# Patient Record
Sex: Female | Born: 1955
Health system: Southern US, Community
[De-identification: ages and names within clinical notes are randomized; demographics above are authoritative.]

## PROBLEM LIST (undated history)

## (undated) DIAGNOSIS — G43909 Migraine, unspecified, not intractable, without status migrainosus: Secondary | ICD-10-CM

## (undated) DIAGNOSIS — E785 Hyperlipidemia, unspecified: Secondary | ICD-10-CM

## (undated) DIAGNOSIS — I10 Essential (primary) hypertension: Secondary | ICD-10-CM

## (undated) HISTORY — DX: Essential (primary) hypertension: I10

## (undated) HISTORY — DX: Hyperlipidemia, unspecified: E78.5

---

## 2000-02-18 ENCOUNTER — Other Ambulatory Visit: Admission: RE | Admit: 2000-02-18 | Discharge: 2000-02-18 | Payer: Self-pay | Admitting: Family Medicine

## 2000-03-16 ENCOUNTER — Encounter: Admission: RE | Admit: 2000-03-16 | Discharge: 2000-03-16 | Payer: Self-pay | Admitting: Family Medicine

## 2000-03-16 ENCOUNTER — Encounter: Payer: Self-pay | Admitting: Family Medicine

## 2001-03-10 ENCOUNTER — Other Ambulatory Visit: Admission: RE | Admit: 2001-03-10 | Discharge: 2001-03-10 | Payer: Self-pay | Admitting: Family Medicine

## 2001-03-18 ENCOUNTER — Encounter: Admission: RE | Admit: 2001-03-18 | Discharge: 2001-03-18 | Payer: Self-pay | Admitting: Family Medicine

## 2001-03-18 ENCOUNTER — Encounter: Payer: Self-pay | Admitting: Family Medicine

## 2002-03-15 ENCOUNTER — Other Ambulatory Visit: Admission: RE | Admit: 2002-03-15 | Discharge: 2002-03-15 | Payer: Self-pay | Admitting: Family Medicine

## 2002-03-24 ENCOUNTER — Encounter: Payer: Self-pay | Admitting: Family Medicine

## 2002-03-24 ENCOUNTER — Encounter: Admission: RE | Admit: 2002-03-24 | Discharge: 2002-03-24 | Payer: Self-pay | Admitting: Family Medicine

## 2002-11-17 ENCOUNTER — Emergency Department (HOSPITAL_COMMUNITY): Admission: EM | Admit: 2002-11-17 | Discharge: 2002-11-18 | Payer: Self-pay | Admitting: Emergency Medicine

## 2002-11-18 ENCOUNTER — Encounter: Payer: Self-pay | Admitting: Emergency Medicine

## 2003-02-16 ENCOUNTER — Other Ambulatory Visit: Admission: RE | Admit: 2003-02-16 | Discharge: 2003-02-16 | Payer: Self-pay | Admitting: Family Medicine

## 2003-04-13 ENCOUNTER — Encounter: Admission: RE | Admit: 2003-04-13 | Discharge: 2003-04-13 | Payer: Self-pay | Admitting: Family Medicine

## 2003-04-13 ENCOUNTER — Encounter: Payer: Self-pay | Admitting: Family Medicine

## 2004-03-06 ENCOUNTER — Other Ambulatory Visit: Admission: RE | Admit: 2004-03-06 | Discharge: 2004-03-06 | Payer: Self-pay | Admitting: Family Medicine

## 2004-04-18 ENCOUNTER — Encounter: Admission: RE | Admit: 2004-04-18 | Discharge: 2004-04-18 | Payer: Self-pay | Admitting: Family Medicine

## 2005-03-20 ENCOUNTER — Ambulatory Visit: Payer: Self-pay | Admitting: Family Medicine

## 2005-03-21 ENCOUNTER — Ambulatory Visit: Payer: Self-pay | Admitting: Family Medicine

## 2005-03-21 ENCOUNTER — Other Ambulatory Visit: Admission: RE | Admit: 2005-03-21 | Discharge: 2005-03-21 | Payer: Self-pay | Admitting: Family Medicine

## 2005-03-21 LAB — CONVERTED CEMR LAB: Pap Smear: NORMAL

## 2005-03-27 ENCOUNTER — Ambulatory Visit: Payer: Self-pay | Admitting: Family Medicine

## 2005-04-21 ENCOUNTER — Encounter: Admission: RE | Admit: 2005-04-21 | Discharge: 2005-04-21 | Payer: Self-pay | Admitting: Family Medicine

## 2005-07-09 ENCOUNTER — Ambulatory Visit: Payer: Self-pay | Admitting: Family Medicine

## 2005-10-13 ENCOUNTER — Ambulatory Visit: Payer: Self-pay | Admitting: Family Medicine

## 2005-10-31 ENCOUNTER — Ambulatory Visit: Payer: Self-pay | Admitting: Family Medicine

## 2005-11-17 ENCOUNTER — Ambulatory Visit: Payer: Self-pay | Admitting: Family Medicine

## 2006-01-06 ENCOUNTER — Ambulatory Visit: Payer: Self-pay | Admitting: Family Medicine

## 2006-01-14 ENCOUNTER — Ambulatory Visit: Payer: Self-pay | Admitting: Family Medicine

## 2006-04-07 ENCOUNTER — Ambulatory Visit: Payer: Self-pay | Admitting: Family Medicine

## 2006-04-10 ENCOUNTER — Other Ambulatory Visit: Admission: RE | Admit: 2006-04-10 | Discharge: 2006-04-10 | Payer: Self-pay | Admitting: Family Medicine

## 2006-04-10 ENCOUNTER — Ambulatory Visit: Payer: Self-pay | Admitting: Family Medicine

## 2006-04-10 ENCOUNTER — Encounter: Payer: Self-pay | Admitting: Family Medicine

## 2006-04-22 ENCOUNTER — Encounter: Admission: RE | Admit: 2006-04-22 | Discharge: 2006-04-22 | Payer: Self-pay | Admitting: Family Medicine

## 2006-07-20 ENCOUNTER — Ambulatory Visit: Payer: Self-pay | Admitting: Family Medicine

## 2007-04-07 ENCOUNTER — Encounter: Payer: Self-pay | Admitting: Family Medicine

## 2007-04-07 DIAGNOSIS — I10 Essential (primary) hypertension: Secondary | ICD-10-CM | POA: Insufficient documentation

## 2007-04-07 DIAGNOSIS — G43009 Migraine without aura, not intractable, without status migrainosus: Secondary | ICD-10-CM | POA: Insufficient documentation

## 2007-04-13 ENCOUNTER — Ambulatory Visit: Payer: Self-pay | Admitting: Family Medicine

## 2007-04-13 DIAGNOSIS — E7800 Pure hypercholesterolemia, unspecified: Secondary | ICD-10-CM | POA: Insufficient documentation

## 2007-04-13 LAB — CONVERTED CEMR LAB
AST: 17 units/L (ref 0–37)
Alkaline Phosphatase: 50 units/L (ref 39–117)
BUN: 11 mg/dL (ref 6–23)
GFR calc Af Amer: 86 mL/min
GFR calc non Af Amer: 71 mL/min
Glucose, Bld: 97 mg/dL (ref 70–99)
HDL: 43.4 mg/dL (ref >39.0)
Potassium: 3.6 meq/L (ref 3.5–5.1)
TSH: 3.62 microintl units/mL (ref 0.35–5.50)
VLDL: 16 mg/dL (ref 0–40)

## 2007-04-15 ENCOUNTER — Other Ambulatory Visit: Admission: RE | Admit: 2007-04-15 | Discharge: 2007-04-15 | Payer: Self-pay | Admitting: Family Medicine

## 2007-04-15 ENCOUNTER — Encounter: Payer: Self-pay | Admitting: Family Medicine

## 2007-04-15 ENCOUNTER — Ambulatory Visit: Payer: Self-pay | Admitting: Family Medicine

## 2007-04-20 ENCOUNTER — Encounter: Payer: Self-pay | Admitting: Family Medicine

## 2007-04-27 ENCOUNTER — Encounter: Admission: RE | Admit: 2007-04-27 | Discharge: 2007-04-27 | Payer: Self-pay | Admitting: Family Medicine

## 2007-04-28 ENCOUNTER — Encounter: Payer: Self-pay | Admitting: Family Medicine

## 2007-10-20 ENCOUNTER — Ambulatory Visit: Payer: Self-pay | Admitting: Family Medicine

## 2008-04-17 ENCOUNTER — Ambulatory Visit: Payer: Self-pay | Admitting: Family Medicine

## 2008-04-17 LAB — CONVERTED CEMR LAB
ALT: 18 units/L (ref 0–35)
AST: 20 units/L (ref 0–37)
Bilirubin, Direct: 0.1 mg/dL (ref 0.0–0.3)
CO2: 28 meq/L (ref 19–32)
Calcium: 9.3 mg/dL (ref 8.4–10.5)
Cholesterol: 178 mg/dL (ref 0–200)
Creatinine, Ser: 1 mg/dL (ref 0.4–1.2)
Eosinophils Relative: 0.9 % (ref 0.0–5.0)
GFR calc Af Amer: 75 mL/min
GFR calc non Af Amer: 62 mL/min
Glucose, Bld: 83 mg/dL (ref 70–99)
HDL: 50.3 mg/dL (ref >39.0)
Hemoglobin: 13.3 g/dL (ref 12.0–15.0)
MCHC: 32.5 g/dL (ref 30.0–36.0)
MCV: 81.2 fL (ref 78.0–100.0)
Monocytes Absolute: 0.3 10*3/uL (ref 0.1–1.0)
Neutrophils Relative %: 72.4 % (ref 43.0–77.0)
Platelets: 303 10*3/uL (ref 150–400)
RDW: 12.7 % (ref 11.5–14.6)
TSH: 2.45 microintl units/mL (ref 0.35–5.50)
Total Bilirubin: 0.8 mg/dL (ref 0.3–1.2)
Total Protein: 7.2 g/dL (ref 6.0–8.3)
Triglycerides: 106 mg/dL (ref 0–149)

## 2008-04-20 ENCOUNTER — Encounter: Payer: Self-pay | Admitting: Family Medicine

## 2008-04-20 ENCOUNTER — Ambulatory Visit: Payer: Self-pay | Admitting: Family Medicine

## 2008-04-20 ENCOUNTER — Other Ambulatory Visit: Admission: RE | Admit: 2008-04-20 | Discharge: 2008-04-20 | Payer: Self-pay | Admitting: Family Medicine

## 2008-04-27 ENCOUNTER — Encounter: Admission: RE | Admit: 2008-04-27 | Discharge: 2008-04-27 | Payer: Self-pay | Admitting: Family Medicine

## 2008-05-01 ENCOUNTER — Encounter (INDEPENDENT_AMBULATORY_CARE_PROVIDER_SITE_OTHER): Payer: Self-pay | Admitting: *Deleted

## 2008-10-16 ENCOUNTER — Ambulatory Visit: Payer: Self-pay | Admitting: Family Medicine

## 2008-12-04 ENCOUNTER — Telehealth: Payer: Self-pay | Admitting: Family Medicine

## 2009-04-13 ENCOUNTER — Ambulatory Visit: Payer: Self-pay | Admitting: Family Medicine

## 2009-04-13 LAB — CONVERTED CEMR LAB
ALT: 21 units/L (ref 0–35)
Albumin: 3.7 g/dL (ref 3.5–5.2)
Alkaline Phosphatase: 62 units/L (ref 39–117)
Basophils Absolute: 0 10*3/uL (ref 0.0–0.1)
Basophils Relative: 0.2 % (ref 0.0–3.0)
Bilirubin, Direct: 0.1 mg/dL (ref 0.0–0.3)
Eosinophils Absolute: 0.1 10*3/uL (ref 0.0–0.7)
Glucose, Bld: 96 mg/dL (ref 70–99)
HDL: 49.8 mg/dL (ref >39.00)
Hemoglobin: 12.6 g/dL (ref 12.0–15.0)
LDL Cholesterol: 104 mg/dL — ABNORMAL HIGH (ref 0–99)
Lymphocytes Relative: 22.1 % (ref 12.0–46.0)
Lymphs Abs: 1.7 10*3/uL (ref 0.7–4.0)
MCHC: 33.5 g/dL (ref 30.0–36.0)
Monocytes Absolute: 0.5 10*3/uL (ref 0.1–1.0)
Platelets: 281 10*3/uL (ref 150.0–400.0)
RDW: 12.9 % (ref 11.5–14.6)
Sodium: 141 meq/L (ref 135–145)
TSH: 2.12 microintl units/mL (ref 0.35–5.50)
Total Bilirubin: 0.8 mg/dL (ref 0.3–1.2)
Total CHOL/HDL Ratio: 3
Total Protein: 7 g/dL (ref 6.0–8.3)
WBC: 7.7 10*3/uL (ref 4.5–10.5)

## 2009-04-18 ENCOUNTER — Other Ambulatory Visit: Admission: RE | Admit: 2009-04-18 | Discharge: 2009-04-18 | Payer: Self-pay | Admitting: Family Medicine

## 2009-04-18 ENCOUNTER — Ambulatory Visit: Payer: Self-pay | Admitting: Family Medicine

## 2009-04-18 ENCOUNTER — Encounter: Payer: Self-pay | Admitting: Family Medicine

## 2009-04-18 DIAGNOSIS — R011 Cardiac murmur, unspecified: Secondary | ICD-10-CM | POA: Insufficient documentation

## 2009-04-18 LAB — CONVERTED CEMR LAB: Pap Smear: NORMAL

## 2009-04-19 ENCOUNTER — Encounter (INDEPENDENT_AMBULATORY_CARE_PROVIDER_SITE_OTHER): Payer: Self-pay | Admitting: *Deleted

## 2009-04-26 ENCOUNTER — Ambulatory Visit: Payer: Self-pay

## 2009-04-26 ENCOUNTER — Ambulatory Visit: Payer: Self-pay | Admitting: Family Medicine

## 2009-04-26 ENCOUNTER — Encounter: Payer: Self-pay | Admitting: Family Medicine

## 2009-04-26 ENCOUNTER — Encounter (INDEPENDENT_AMBULATORY_CARE_PROVIDER_SITE_OTHER): Payer: Self-pay | Admitting: *Deleted

## 2009-04-26 LAB — CONVERTED CEMR LAB: OCCULT 3: NEGATIVE

## 2009-04-26 LAB — FECAL OCCULT BLOOD, GUAIAC: Fecal Occult Blood: NEGATIVE

## 2009-05-01 ENCOUNTER — Encounter: Admission: RE | Admit: 2009-05-01 | Discharge: 2009-05-01 | Payer: Self-pay | Admitting: Family Medicine

## 2009-05-03 ENCOUNTER — Encounter: Admission: RE | Admit: 2009-05-03 | Discharge: 2009-05-03 | Payer: Self-pay | Admitting: Family Medicine

## 2009-11-05 ENCOUNTER — Encounter: Admission: RE | Admit: 2009-11-05 | Discharge: 2009-11-05 | Payer: Self-pay | Admitting: Family Medicine

## 2009-11-05 DIAGNOSIS — R928 Other abnormal and inconclusive findings on diagnostic imaging of breast: Secondary | ICD-10-CM | POA: Insufficient documentation

## 2010-01-17 ENCOUNTER — Telehealth: Payer: Self-pay | Admitting: Family Medicine

## 2010-01-24 LAB — HM PAP SMEAR

## 2010-04-23 ENCOUNTER — Other Ambulatory Visit: Admission: RE | Admit: 2010-04-23 | Discharge: 2010-04-23 | Payer: Self-pay | Admitting: Family Medicine

## 2010-04-23 ENCOUNTER — Ambulatory Visit: Payer: Self-pay | Admitting: Family Medicine

## 2010-04-23 LAB — CONVERTED CEMR LAB
Cholesterol, target level: 200 mg/dL
LDL Goal: 160 mg/dL

## 2010-04-24 ENCOUNTER — Ambulatory Visit: Payer: Self-pay | Admitting: Family Medicine

## 2010-04-24 DIAGNOSIS — R5381 Other malaise: Secondary | ICD-10-CM | POA: Insufficient documentation

## 2010-04-24 DIAGNOSIS — R5383 Other fatigue: Secondary | ICD-10-CM

## 2010-04-26 LAB — CONVERTED CEMR LAB
ALT: 23 units/L (ref 0–35)
AST: 23 units/L (ref 0–37)
Albumin: 3.7 g/dL (ref 3.5–5.2)
Alkaline Phosphatase: 61 units/L (ref 39–117)
BUN: 13 mg/dL (ref 6–23)
Basophils Absolute: 0.1 10*3/uL (ref 0.0–0.1)
CO2: 27 meq/L (ref 19–32)
Eosinophils Absolute: 0.1 10*3/uL (ref 0.0–0.7)
Eosinophils Relative: 1.1 % (ref 0.0–5.0)
GFR calc non Af Amer: 88.85 mL/min (ref >60)
Glucose, Bld: 87 mg/dL (ref 70–99)
HDL: 62.9 mg/dL (ref >39.00)
Hemoglobin: 12.2 g/dL (ref 12.0–15.0)
Lymphs Abs: 2.3 10*3/uL (ref 0.7–4.0)
Monocytes Absolute: 0.5 10*3/uL (ref 0.1–1.0)
Monocytes Relative: 5.9 % (ref 3.0–12.0)
Neutrophils Relative %: 65.2 % (ref 43.0–77.0)
Platelets: 286 10*3/uL (ref 150.0–400.0)
Potassium: 3.9 meq/L (ref 3.5–5.1)
RBC: 4.46 M/uL (ref 3.87–5.11)
Sodium: 142 meq/L (ref 135–145)
Total CHOL/HDL Ratio: 3
WBC: 8.6 10*3/uL (ref 4.5–10.5)

## 2010-05-02 LAB — CONVERTED CEMR LAB: Pap Smear: NEGATIVE

## 2010-05-03 ENCOUNTER — Encounter (INDEPENDENT_AMBULATORY_CARE_PROVIDER_SITE_OTHER): Payer: Self-pay | Admitting: *Deleted

## 2010-05-10 ENCOUNTER — Encounter: Admission: RE | Admit: 2010-05-10 | Discharge: 2010-05-10 | Payer: Self-pay | Admitting: Family Medicine

## 2010-05-17 ENCOUNTER — Encounter (INDEPENDENT_AMBULATORY_CARE_PROVIDER_SITE_OTHER): Payer: Self-pay

## 2010-05-21 ENCOUNTER — Ambulatory Visit: Payer: Self-pay | Admitting: Gastroenterology

## 2010-09-13 ENCOUNTER — Ambulatory Visit: Payer: Self-pay | Admitting: Internal Medicine

## 2010-09-13 DIAGNOSIS — J019 Acute sinusitis, unspecified: Secondary | ICD-10-CM | POA: Insufficient documentation

## 2010-11-11 ENCOUNTER — Encounter
Admission: RE | Admit: 2010-11-11 | Discharge: 2010-11-11 | Payer: Self-pay | Source: Home / Self Care | Attending: Family Medicine | Admitting: Family Medicine

## 2010-12-21 ENCOUNTER — Other Ambulatory Visit: Payer: Self-pay | Admitting: Family Medicine

## 2010-12-21 DIAGNOSIS — N63 Unspecified lump in unspecified breast: Secondary | ICD-10-CM

## 2010-12-31 NOTE — Letter (Signed)
Summary: [facility] Instructions  [facility]  [address] [place], [place] [address]   Phone: [phone]  Fax: [phone]       KHALESSI BLOUGH    21-Jun-1956    MRN: [id number]        Procedure Day Dorna Bloom:  Farrell Ours  06/07/10     Arrival Time:  9:30AM     Procedure Time:  10:30AM     Location of Procedure:                    _X _  [facility] (4th Floor)                       PREPARATION FOR COLONOSCOPY WITH MOVIPREP   Starting 5 days prior to your procedure 06/02/10 do not eat nuts, seeds, popcorn, corn, beans, peas,  salads, or any raw vegetables.  Do not take any fiber supplements (e.g. Metamucil, Citrucel, and Benefiber).  THE DAY BEFORE YOUR PROCEDURE         DATE: 06/06/10  DAY: THURSDAY  1.  Drink clear liquids the entire day-NO SOLID FOOD  2.  Do not drink anything colored red or purple.  Avoid juices with pulp.  No orange juice.  3.  Drink at least 64 oz. (8 glasses) of fluid/clear liquids during the day to prevent dehydration and help the prep work efficiently.  CLEAR LIQUIDS INCLUDE: Water Jello Ice Popsicles Tea (sugar ok, no milk/cream) Powdered fruit flavored drinks Coffee (sugar ok, no milk/cream) Gatorade Juice: apple, white grape, white cranberry  Lemonade Clear bullion, consomm, broth Carbonated beverages (any kind) Strained chicken noodle soup Hard Candy                             4.  In the morning, mix first dose of MoviPrep solution:    Empty 1 Pouch A and 1 Pouch B into the disposable container    Add lukewarm drinking water to the top line of the container. Mix to dissolve    Refrigerate (mixed solution should be used within 24 hrs)  5.  Begin drinking the prep at 5:00 p.m. The MoviPrep container is divided by 4 marks.   Every 15 minutes drink the solution down to the next mark (approximately 8 oz) until the full liter is complete.   6.  Follow completed prep with 16 oz of clear liquid of your choice (Nothing red  or purple).  Continue to drink clear liquids until bedtime.  7.  Before going to bed, mix second dose of MoviPrep solution:    Empty 1 Pouch A and 1 Pouch B into the disposable container    Add lukewarm drinking water to the top line of the container. Mix to dissolve    Refrigerate  THE DAY OF YOUR PROCEDURE      DATE: 06/07/10  DAY: FRIDAY  Beginning at 5:30AM (5 hours before procedure):         1. Every 15 minutes, drink the solution down to the next mark (approx 8 oz) until the full liter is complete.  2. Follow completed prep with 16 oz. of clear liquid of your choice.    3. You may drink clear liquids until 8:30AM (2 HOURS BEFORE PROCEDURE).   MEDICATION INSTRUCTIONS  Unless otherwise instructed, you should take regular prescription medications with a small sip of water   as early as possible the morning of  your procedure..  Additional medication instructions: Do not take Diovan am of procedure.         OTHER INSTRUCTIONS  You will need a responsible adult at least 54 years of age to accompany you and drive you home.   This person must remain in the waiting room during your procedure.  Wear loose fitting clothing that is easily removed.  Leave jewelry and other valuables at home.  However, you may wish to bring a book to read or  an iPod/MP3 player to listen to music as you wait for your procedure to start.  Remove all body piercing jewelry and leave at home.  Total time from sign-in until discharge is approximately 2-3 hours.  You should go home directly after your procedure and rest.  You can resume normal activities the  day after your procedure.  The day of your procedure you should not:   Drive   Make legal decisions   Operate machinery   Drink alcohol   Return to work  You will receive specific instructions about eating, activities and medications before you leave.    The above instructions have been reviewed and explained to me by   Ulis Rias RN  May 21, 2010 8:54 AM     I fully understand and can verbalize these instructions _____________________________ Date _________

## 2010-12-31 NOTE — Assessment & Plan Note (Signed)
Summary: CPX AND TRANSFER CARE FROM [facility]   Vital Signs:  Patient profile:   55 year old female Height:      62.25 inches Weight:      159.4 pounds BMI:     29.03 Temp:     97.9 degrees F oral Pulse rate:   68 / minute Pulse rhythm:   regular BP sitting:   120 / 84  (left arm) Cuff size:   regular  Vitals Entered By: Benny Lennert CMA Duncan Dull) (Apr 23, 2010 11:45 AM)  History of Present Illness: Chief complaint cpx and transfer from [facility] The patient is here for annual wellness exam and preventative care.    Migraines well controlled.Marland Kitchenonce per month on topamax 75 mg daily.  Hypertension History:      She denies headache, chest pain, palpitations, dyspnea with exertion, peripheral edema, neurologic problems, syncope, and side effects from treatment.  She notes no problems with any antihypertensive medication side effects.  rarelty checking. Marland Kitchen        Positive major cardiovascular risk factors include hyperlipidemia and hypertension.  Negative major cardiovascular risk factors include female age less than 32 years old and non-tobacco-user status.    Lipid Management History:      Positive NCEP/ATP III risk factors include hypertension.  Negative NCEP/ATP III risk factors include female age less than 48 years old and non-tobacco-user status.      Allergies (verified): No Known Drug Allergies  Past History:  Past medical, surgical, family and social histories (including risk factors) reviewed, and no changes noted (except as noted below).  Past Medical History: Reviewed history from 04/07/2007 and no changes required. Hypertension  Past Surgical History: Reviewed history from 04/26/2009 and no changes required. ECHO Mild MR, EF 55-60% 04/26/09  Family History: Reviewed history from 04/18/2009 and no changes required. Father dec 53 gunshot wound to head Mother  A 72  HTN   Ca Ovaries, recurrence Brother A 53 HTN Brother A 45  Social History: Reviewed history  from 04/07/2007 and no changes required. Occupation: [employer] Married lives w/ husband Never Smoked Alcohol use-no Drug use-no  Review of Systems General:  Complains of fatigue; denies fever. CV:  Denies chest pain or discomfort. Resp:  Denies shortness of breath. GI:  Denies abdominal pain. GU:  Denies dysuria.  Physical Exam  General:  Well-developed,well-nourished,in no acute distress; alert,appropriate and cooperative throughout examination Eyes:  Conjunctiva clear bilaterally.  Ears:  External ear exam shows no significant lesions or deformities.  Otoscopic examination reveals clear canals, tympanic membranes are intact bilaterally without bulging, retraction, inflammation or discharge. Hearing is grossly normal bilaterally. Nose:  External nasal examination shows no deformity or inflammation. Nasal mucosa are pink and moist without lesions or exudates. Mouth:  Oral mucosa and oropharynx without lesions or exudates.  Teeth in good repair. Neck:  No deformities, masses, or tenderness noted. Chest Wall:  No deformities, masses, or tenderness noted. Breasts:  No mass, nodules, thickening, tenderness, bulging, retraction, inflamation, nipple discharge or skin changes noted.   Lungs:  Normal respiratory effort, chest expands symmetrically. Lungs are clear to auscultation, no crackles or wheezes. Heart:  Normal rate and regular rhythm. S1 and S2 normal without gallop, click, rub or other extra sounds. Murmur I-II/VI best at RUSB, no radiation heard. Abdomen:  Bowel sounds positive,abdomen soft and non-tender without masses, organomegaly or hernias noted. Genitalia:  normal introitus, no external lesions, no vaginal discharge, mucosa pink and moist, normal uterus size and position, and no adnexal  masses or tenderness.   no pap performed Pulses:  R and L carotid,radial,femoral,dorsalis pedis and posterior tibial pulses are full and equal bilaterally Extremities:  No clubbing,  cyanosis, edema, or deformity noted with normal full range of motion of all joints.   Skin:  Intact without suspicious lesions or rashes Psych:  Cognition and judgment appear intact. Alert and cooperative with normal attention span and concentration. No apparent delusions, illusions, hallucinations   Impression & Recommendations:  Problem # 1:  HEALTH MAINTENANCE EXAM (ICD-V70.0) The patient's preventative maintenance and recommended screening tests for an annual wellness exam were reviewed in full today. Brought up to date unless services declined.  Counselled on the importance of diet, exercise, and its role in overall health and mortality. The patient's FH and SH was reviewed, including their home life, tobacco status, and drug and alcohol status.     Problem # 2:  HYPERTENSION (ICD-401.9) Well controlled. Continue current medication.  Her updated medication list for this problem includes:    Atenolol 100 Mg Tabs (Atenolol) .Marland Kitchen... 1 tablet daily by mouth    Diovan Hct 160-12.5 Mg Tabs (Valsartan-hydrochlorothiazide) .Marland Kitchen... 1 tablet daily by mouth  BP today: 120/84 Prior BP: 120/70 (04/18/2009)  10 Yr Risk Heart Disease: 8 %  Labs Reviewed: K+: 3.8 (04/13/2009) Creat: : 1.0 (04/13/2009)   Chol: 166 (04/13/2009)   HDL: 49.80 (04/13/2009)   LDL: 104 (04/13/2009)   TG: 59.0 (04/13/2009)  Problem # 3:  FATIGUE (ICD-780.79) eval with labs. Increase exercise and healthy eating habits.   Problem # 4:  MIGRAINE, COMMON (ICD-346.10) Well controlled. Continue current medication. Encouraged exercise, weight loss, healthy eating habits.  The following medications were removed from the medication list:    Mobic 7.5 Mg Tabs (Meloxicam) .Marland Kitchen... 1 daily by mouth as directed    Hydrocodone-acetaminophen 5-500 Mg Tabs (Hydrocodone-acetaminophen) .Marland Kitchen... 1 every4 hours as directed by mouth Her updated medication list for this problem includes:    Atenolol 100 Mg Tabs (Atenolol) .Marland Kitchen... 1 tablet daily  by mouth    Imitrex 100 Mg Tabs (Sumatriptan succinate) .Marland Kitchen... Take one by mouth as directed prn  Complete Medication List: 1)  Atenolol 100 Mg Tabs (Atenolol) .Marland Kitchen.. 1 tablet daily by mouth 2)  Diovan Hct 160-12.5 Mg Tabs (Valsartan-hydrochlorothiazide) .Marland Kitchen.. 1 tablet daily by mouth 3)  Topamax 25 Mg Tabs (Topiramate) .... Take one by mouth daily 4)  Clarinex 5 Mg Tabs (Desloratadine) .... Take one by mouth daily prn 5)  Imitrex 100 Mg Tabs (Sumatriptan succinate) .... Take one by mouth as directed prn 6)  Nortrel 7/7/7 0.5/0.75/1-35 Mg-mcg Tabs (Norethin-eth estrad triphasic) .Marland Kitchen.. 1 daily by mouth as directed  Other Orders: Gastroenterology Referral (GI)  Hypertension Assessment/Plan:      The patient's hypertensive risk group is category B: At least one risk factor (excluding diabetes) with no target organ damage.  Her calculated 10 year risk of coronary heart disease is 8 %.  Today's blood pressure is 120/84.  Her blood pressure goal is < 140/90.  Lipid Assessment/Plan:      Based on NCEP/ATP III, the patient's risk factor category is "0-1 risk factors".  The patient's lipid goals are as follows: Total cholesterol goal is 200; LDL cholesterol goal is 160; HDL cholesterol goal is 40; Triglyceride goal is 150.    Patient Instructions: 1)  Fasting lipids, CMET Dx 272.0, TSH, cbc Dx 780.79 2)  Keep appt for early breast mammogram next month.Return stool cards. 3)  Referral Appointment Information 4)  Day/Date:  5)  Time: 6)  Place/MD: 7)  Address: 8)  Phone/Fax: 9)  Patient given appointment information. Information/Orders faxed/mailed.   Current Allergies (reviewed today): No known allergies  ifob test canceled per doctor. Flex Sig Next Due:  Not Indicated Last PAP:  Normal, Satisfactory (04/18/2009 4:17:19 PM) PAP Next Due:  1 yr Mother with ovarian cancer.

## 2010-12-31 NOTE — Miscellaneous (Signed)
Summary: Lec previsit  Clinical Lists Changes  Medications: Added new medication of MOVIPREP 100 GM  SOLR (PEG-KCL-NACL-NASULF-NA ASC-C) As per prep instructions. - Signed Rx of MOVIPREP 100 GM  SOLR (PEG-KCL-NACL-NASULF-NA ASC-C) As per prep instructions.;  #1 x 0;  Signed;  Entered by: Ulis Rias RN;  Authorized by: Rachael Fee MD;  Method used: [id number] [address], [place], [place], [place]  [address], Ph: [phone], Fax: [phone] Observations: Added new observation of NKA: T (05/21/2010 7:53)    Prescriptions: MOVIPREP 100 GM  SOLR (PEG-KCL-NACL-NASULF-NA ASC-C) As per prep instructions.  #1 x 0   Entered by:   Ulis Rias RN   Authorized by:   Rachael Fee MD   Signed by:   Ulis Rias RN on 05/21/2010   Method used:   [facility] [id number]       [address]       [place], [place]  [address]       Ph: [phone]       Fax: [phone]   RxID:   [id number]

## 2010-12-31 NOTE — Letter (Signed)
Summary: Results Follow up Letter  [place] [facility]  [address] [place], [place] [address]   Phone: [phone]  Fax: [phone]    05/03/2010 MRN: [id number]     Julie Baker [address] [place], [place]  [address]  Dear [name],    The following are the results of your recent test(s):  Test         Result    Pap Smear:        Normal __x___  Not Normal _____ Comments: Repeat in 1 year ______________________________________________________ Cholesterol: LDL(Bad cholesterol):         Your goal is less than:         HDL (Good cholesterol):       Your goal is more than: Comments:  ______________________________________________________ Mammogram:        Normal _____  Not Normal _____ Comments:  ___________________________________________________________________ Hemoccult:        Normal _____  Not normal _______ Comments:    _____________________________________________________________________ Other Tests:    We routinely do not discuss normal results over the telephone.  If you desire a copy of the results, or you have any questions about this information we can discuss them at your next office visit.   Sincerely,  Kerby Nora MD

## 2010-12-31 NOTE — Progress Notes (Signed)
Summary: pt walked in  Phone Note Call from Patient   Summary of Call: Pt walked in complaining of cold sxs and saying she thought she had a fever.  I advised her that we didnt have any appts and offered her appt at [facility] or appt here tomorrow.  She said no and walked out. Initial call taken by: Lowella Petties CMA,  January 17, 2010 1:31 PM

## 2010-12-31 NOTE — Assessment & Plan Note (Signed)
Summary: SINUS SXS   Vital Signs:  Patient profile:   55 year old female Weight:      165.75 pounds Temp:     98.5 degrees F oral Pulse rate:   74 / minute Pulse rhythm:   regular BP sitting:   124 / 82  (left arm) Cuff size:   regular  Vitals Entered By: Selena Batten Dance CMA Duncan Dull) (September 13, 2010 12:14 PM) CC: ? sinus symptoms   History of Present Illness: CC: feeling lousy  8-9d h/o feeling bad.  Started with ST.  Feels like running fever at night, + dry cough and runy nose, dry mouth like lips chapped but on inside.  taking theraflu and sucking on ricola.  not feeling well.  + muffled ear on right.  + R jaw pain.  + congestion and R sided HA that started yesterday.  no vision changes.  No n/v/d.  No myalgias or arthralgias.  no muscle weakness  + sick contacts at work.  no mokers at home.  + smokers at work.  not real issues with alergic rhinitis.  on clarinex.  Current Medications (verified): 1)  Atenolol 100 Mg Tabs (Atenolol) .Marland Kitchen.. 1 Tablet Daily By Mouth 2)  Diovan Hct 160-12.5 Mg Tabs (Valsartan-Hydrochlorothiazide) .Marland Kitchen.. 1 Tablet Daily By Mouth 3)  Topamax 25 Mg Tabs (Topiramate) .... 3 At Night 4)  Clarinex 5 Mg Tabs (Desloratadine) .... Take One By Mouth Daily Prn 5)  Imitrex 100 Mg Tabs (Sumatriptan Succinate) .... Take One By Mouth As Directed Prn 6)  Nortrel 7/7/7 0.5/0.75/1-35 Mg-Mcg Tabs (Norethin-Eth Estrad Triphasic) .Marland Kitchen.. 1 Daily By Mouth As Directed  Allergies (verified): No Known Drug Allergies  Past History:  Past Medical History: Last updated: 04/07/2007 Hypertension  Review of Systems       per HPI  Physical Exam  General:  Well-developed,well-nourished,in no acute distress; alert,appropriate and cooperative throughout examination Head:  Normocephalic and atraumatic without obvious abnormalities. No apparent alopecia or balding. tender to palpation R temporal region Eyes:  No corneal or conjunctival inflammation noted. EOMI. Perrla. Funduscopic  exam benign, clear optic disc margin visualized bilaterally.  Vision grossly normal.   Ears:  + fluid behind TMs bilaterally, no erythema or bulging, good cone of light Nose:  External nasal examination shows no deformity or inflammation. Nasal mucosa dry Mouth:  Oral mucosa and oropharynx without lesions or exudates.  Teeth in good repair.  slight pharyngeal erythema. Neck:  No deformities, masses, or tenderness noted. Lungs:  Normal respiratory effort, chest expands symmetrically. Lungs are clear to auscultation, no crackles or wheezes. Heart:  Normal rate and regular rhythm. S1 and S2 normal without gallop, click, rub or other extra sounds. Murmur I-II/VI best at RUSB, no radiation heard. Pulses:  2+ rad pulses Extremities:  no edema   Impression & Recommendations:  Problem # 1:  SINUSITIS, ACUTE (ICD-461.9) treat with amoxicillin x 10 days.  discussed red flags to look for temporal arteritis, if happens return for eval.  however not consistent with this but rather sinusitis.  optic discs visualized and intact.  treat supportively, return if not improving as expected. Her updated medication list for this problem includes:    Amoxicillin 875 Mg Tabs (Amoxicillin) ..... One pill twice daily x 10 days  Complete Medication List: 1)  Atenolol 100 Mg Tabs (Atenolol) .Marland Kitchen.. 1 tablet daily by mouth 2)  Diovan Hct 160-12.5 Mg Tabs (Valsartan-hydrochlorothiazide) .Marland Kitchen.. 1 tablet daily by mouth 3)  Topamax 25 Mg Tabs (Topiramate) .... 3 at night  4)  Clarinex 5 Mg Tabs (Desloratadine) .... Take one by mouth daily prn 5)  Imitrex 100 Mg Tabs (Sumatriptan succinate) .... Take one by mouth as directed prn 6)  Nortrel 7/7/7 0.5/0.75/1-35 Mg-mcg Tabs (Norethin-eth estrad triphasic) .Marland Kitchen.. 1 daily by mouth as directed 7)  Amoxicillin 875 Mg Tabs (Amoxicillin) .... One pill twice daily x 10 days  Patient Instructions: 1)  You have a sinus infection. 2)  Take medicines as prescribed: amoxicillin twice daily  for 10 days. 3)  Take guaifenesin 400mg  IR 1 pills in am and at noon with plenty of fluid to help mobilize mucous.  tylenol/ibuprofen for inflammation as needed. 4)  Use nasal saline spray or neti pot to help drainage of sinuses. 5)  If you start having fevers >101.5, trouble swallowing or breathing, or are worsening instead of improving as expected, you may need to be seen again. 6)  Good to see you today, call clinic with questions.  Prescriptions: AMOXICILLIN 875 MG TABS (AMOXICILLIN) one pill twice daily x 10 days  #20 x 0   Entered and Authorized by:   Eustaquio Boyden  MD   Signed by:   Eustaquio Boyden  MD on 09/13/2010   Method used:   [facility]  [address] [phone][facility] (retail)       [address]       [place], [place]  [address]       Ph: [phone]       Fax: [phone]   RxID:   [id number]   Current Allergies (reviewed today): No known allergies   Appended Document: SINUS SXS [facility] called to [facility], they didnt get script sent in earlier.

## 2011-04-09 ENCOUNTER — Other Ambulatory Visit: Payer: Self-pay | Admitting: Family Medicine

## 2011-04-26 ENCOUNTER — Encounter: Payer: Self-pay | Admitting: Family Medicine

## 2011-04-29 ENCOUNTER — Telehealth (INDEPENDENT_AMBULATORY_CARE_PROVIDER_SITE_OTHER): Payer: 59 | Admitting: Family Medicine

## 2011-04-29 DIAGNOSIS — E7800 Pure hypercholesterolemia, unspecified: Secondary | ICD-10-CM

## 2011-04-29 NOTE — Telephone Encounter (Signed)
Message copied by Excell Seltzer on Tue Apr 29, 2011  8:43 AM ------      Message from: Baldomero Lamy      Created: Wed Apr 23, 2011 10:23 AM      Regarding: Cpx labs wed 5/30       Please order  future cpx labs for pt's upcomming lab appt.      Thanks      Rodney Booze

## 2011-04-30 ENCOUNTER — Other Ambulatory Visit (INDEPENDENT_AMBULATORY_CARE_PROVIDER_SITE_OTHER): Payer: 59 | Admitting: Family Medicine

## 2011-04-30 DIAGNOSIS — E7800 Pure hypercholesterolemia, unspecified: Secondary | ICD-10-CM

## 2011-04-30 LAB — COMPREHENSIVE METABOLIC PANEL
ALT: 26 U/L (ref 0–35)
AST: 22 U/L (ref 0–37)
Albumin: 3.7 g/dL (ref 3.5–5.2)
Calcium: 9.1 mg/dL (ref 8.4–10.5)
Chloride: 107 mEq/L (ref 96–112)
Potassium: 3.9 mEq/L (ref 3.5–5.1)
Sodium: 139 mEq/L (ref 135–145)

## 2011-04-30 LAB — LIPID PANEL: Total CHOL/HDL Ratio: 3

## 2011-05-02 ENCOUNTER — Encounter: Payer: Self-pay | Admitting: Family Medicine

## 2011-05-02 ENCOUNTER — Telehealth: Payer: Self-pay | Admitting: Family Medicine

## 2011-05-02 ENCOUNTER — Other Ambulatory Visit (HOSPITAL_COMMUNITY)
Admission: RE | Admit: 2011-05-02 | Discharge: 2011-05-02 | Disposition: A | Discharge status: Home / Self Care | Payer: 59 | Source: Ambulatory Visit | Attending: Family Medicine | Admitting: Family Medicine

## 2011-05-02 ENCOUNTER — Ambulatory Visit (INDEPENDENT_AMBULATORY_CARE_PROVIDER_SITE_OTHER): Payer: 59 | Admitting: Family Medicine

## 2011-05-02 DIAGNOSIS — E7800 Pure hypercholesterolemia, unspecified: Secondary | ICD-10-CM

## 2011-05-02 DIAGNOSIS — Z01419 Encounter for gynecological examination (general) (routine) without abnormal findings: Secondary | ICD-10-CM | POA: Insufficient documentation

## 2011-05-02 DIAGNOSIS — Z0000 Encounter for general adult medical examination without abnormal findings: Secondary | ICD-10-CM

## 2011-05-02 DIAGNOSIS — I10 Essential (primary) hypertension: Secondary | ICD-10-CM

## 2011-05-02 NOTE — Patient Instructions (Addendum)
Take medicine on schedule.  Follow BP  And pulse at home/pharmacy  Goal BP > 140/90, pulse >60..call if above these more than 3 times.  Try to work on healthy diet. Increase exercise to 5 days a week.  Start calcium and vit D 600 mg/400 IU daily.

## 2011-05-02 NOTE — Telephone Encounter (Signed)
Message copied by Excell Seltzer on Fri May 02, 2011  1:13 PM ------      Message from: Wyatt Portela      Created: Fri May 02, 2011 12:24 PM      Regarding: transfer pcp      Contact: [phone]       Ms Dermody would like to transfer from Dr Ermalene Searing  To Dr Sharen Hones.      Please advise      Thank you      Zella Ball

## 2011-05-02 NOTE — Telephone Encounter (Signed)
Ok by me

## 2011-05-02 NOTE — Telephone Encounter (Signed)
This pt is very pleasant, not complicated. Dr. Reece Agar should enjoy having her as a patient.

## 2011-05-02 NOTE — Progress Notes (Signed)
  Subjective:    Patient ID: Julie Baker, female    DOB: 1956-01-09, 55 y.o.   MRN: [id number]  HPI The patient is here for annual wellness exam and preventative care.    Hypertension:   On atenolol, diovan HCTZ Using medication without problems or lightheadedness:  Chest pain with exertion: None Edema:None Short of breath:None Average home BPs: does not measure at home. Other issues: None Rare lightheadedness or dizziness,   Elevated Cholesterol:Well controlled on no medication.  Exercise: walking 2-3 times a week Diet: moderate, eats some junk, chips, fast food, eats out a lot. Fruits and veggies occ. Some water. No calcium.    Review of Systems     Objective:   Physical Exam  Constitutional: Vital signs are normal. She appears well-developed and well-nourished. She is cooperative.  Non-toxic appearance. She does not appear ill. No distress.  HENT:  Head: Normocephalic.  Right Ear: Hearing, tympanic membrane, external ear and ear canal normal.  Left Ear: Hearing, tympanic membrane, external ear and ear canal normal.  Nose: Nose normal.  Eyes: Conjunctivae, EOM and lids are normal. Pupils are equal, round, and reactive to light. No foreign bodies found.  Neck: Trachea normal and normal range of motion. Neck supple. Carotid bruit is not present. No mass and no thyromegaly present.  Cardiovascular: Normal rate, regular rhythm, S1 normal, S2 normal, normal heart sounds and intact distal pulses.  Exam reveals no gallop.   No murmur heard. Pulmonary/Chest: Effort normal and breath sounds normal. No respiratory distress. She has no wheezes. She has no rhonchi. She has no rales.  Abdominal: Soft. Normal appearance and bowel sounds are normal. She exhibits no distension, no fluid wave, no abdominal bruit and no mass. There is no hepatosplenomegaly. There is no tenderness. There is no rebound, no guarding and no CVA tenderness. No hernia.  Genitourinary: Vagina normal and uterus  normal. No breast swelling, tenderness, discharge or bleeding. Pelvic exam was performed with patient prone. There is no rash, tenderness or lesion on the right labia. There is no rash, tenderness or lesion on the left labia. Uterus is not enlarged and not tender. Cervix exhibits no motion tenderness, no discharge and no friability. Right adnexum displays no mass, no tenderness and no fullness. Left adnexum displays no mass, no tenderness and no fullness.  Lymphadenopathy:    She has no cervical adenopathy.    She has no axillary adenopathy.  Neurological: She is alert. She has normal strength. No cranial nerve deficit or sensory deficit.  Skin: Skin is warm, dry and intact. No rash noted.  Psychiatric: Her speech is normal and behavior is normal. Judgment normal. Her mood appears not anxious. Cognition and memory are normal. She does not exhibit a depressed mood.          Assessment & Plan:   Complete Physical Exam: The patient's preventative maintenance and recommended screening tests for an annual wellness exam were reviewed in full today. Brought up to date unless services declined.  Counselled on the importance of diet, exercise, and its role in overall health and mortality. The patient's FH and SH was reviewed, including their home life, tobacco status, and drug and alcohol status.   Has repeat mammo this month because was abnormal in 10/2010. Stool cards for cc screen.

## 2011-05-06 ENCOUNTER — Encounter: Payer: Self-pay | Admitting: *Deleted

## 2011-05-07 ENCOUNTER — Other Ambulatory Visit: Payer: Self-pay | Admitting: Family Medicine

## 2011-05-12 ENCOUNTER — Ambulatory Visit
Admission: RE | Admit: 2011-05-12 | Discharge: 2011-05-12 | Disposition: A | Discharge status: Home / Self Care | Payer: 59 | Source: Ambulatory Visit | Attending: Family Medicine | Admitting: Family Medicine

## 2011-05-12 DIAGNOSIS — N63 Unspecified lump in unspecified breast: Secondary | ICD-10-CM

## 2011-05-13 ENCOUNTER — Other Ambulatory Visit: Payer: 59

## 2011-05-13 ENCOUNTER — Other Ambulatory Visit: Payer: Self-pay | Admitting: Family Medicine

## 2011-05-13 ENCOUNTER — Encounter: Payer: Self-pay | Admitting: *Deleted

## 2011-05-13 DIAGNOSIS — Z1211 Encounter for screening for malignant neoplasm of colon: Secondary | ICD-10-CM

## 2011-05-13 LAB — FECAL OCCULT BLOOD, IMMUNOCHEMICAL: Fecal Occult Bld: NEGATIVE

## 2011-05-14 NOTE — Progress Notes (Signed)
Notify pt negative hemeoccult stool

## 2011-05-14 NOTE — Assessment & Plan Note (Signed)
Take medicine on schedule.  Follow BP  And pulse at home/pharmacy  Goal BP > 140/90, pulse >60..call if above these more than 3 times.  Try to work on healthy diet. Increase exercise to 5 days a week.

## 2011-05-14 NOTE — Assessment & Plan Note (Signed)
Well controlled on no medication  

## 2011-09-05 ENCOUNTER — Other Ambulatory Visit: Payer: Self-pay | Admitting: Family Medicine

## 2011-10-29 ENCOUNTER — Other Ambulatory Visit: Payer: Self-pay | Admitting: Family Medicine

## 2011-12-04 ENCOUNTER — Other Ambulatory Visit: Payer: Self-pay | Admitting: Family Medicine

## 2011-12-09 ENCOUNTER — Encounter (HOSPITAL_COMMUNITY): Payer: Self-pay | Admitting: Emergency Medicine

## 2011-12-09 ENCOUNTER — Emergency Department (INDEPENDENT_AMBULATORY_CARE_PROVIDER_SITE_OTHER): Payer: 59

## 2011-12-09 ENCOUNTER — Emergency Department (HOSPITAL_COMMUNITY)
Admission: EM | Admit: 2011-12-09 | Discharge: 2011-12-09 | Disposition: A | Discharge status: Home / Self Care | Payer: 59 | Source: Home / Self Care | Attending: Emergency Medicine | Admitting: Emergency Medicine

## 2011-12-09 DIAGNOSIS — M109 Gout, unspecified: Secondary | ICD-10-CM

## 2011-12-09 HISTORY — DX: Migraine, unspecified, not intractable, without status migrainosus: G43.909

## 2011-12-09 MED ORDER — INDOMETHACIN 50 MG PO CAPS: 50.0000 mg | ORAL_CAPSULE | Freq: Two times a day (BID) | ORAL | Status: DC

## 2011-12-09 NOTE — ED Notes (Signed)
HERE WITH LEFT KNEE SUDDEN INTERMIT SORENESS AND BURNING THAT STARTED YESTERDAY.DENIES INJURY OR HX ARTHRITIS.SWELLING SEEN AND PT UNABLE TO FULLY BEND KNEE.

## 2011-12-09 NOTE — ED Provider Notes (Signed)
Chief Complaint  Patient presents with  . Knee Pain    History of Present Illness:  Julie Baker has had a two-day history of pain and swelling of the left knee. This feels hot and red. She denies any injury to the area. It hurts somewhat to walk or to bend the knee. There is no locking or giving way of the joint. She has no history of gout or previous history of knee pain or other joint pain. She denies fevers, chills, or sweats. She does have high blood pressure and is on Diovan/HCTZ and also has migraines as well.  Review of Systems:  Other than noted above, the patient denies any of the following symptoms: Systemic:  No fevers, chills, sweats, or aches.  No fatigue or tiredness. Musculoskeletal:  No joint pain, arthritis, bursitis, swelling, back pain, or neck pain. Neurological:  No muscular weakness, paresthesias, headache, or trouble with speech or coordination.  No dizziness.   PMFSH:  Past medical history, family history, social history, meds, and allergies were reviewed.  Physical Exam:   Vital signs:  BP 143/76  Pulse 75  Temp(Src) 98.9 F (37.2 C) (Oral)  Resp 18  SpO2 98% Gen:  Alert and oriented times 3.  In no distress. Musculoskeletal: Exam of the left knee reveals swelling, redness, heat, and tenderness to palpation. The knee has just a few degrees of movement with pain. I was unable to do McMurray's sign or Lachman sign. Otherwise, all joints had a full a ROM with no swelling, bruising or deformity.  No edema, pulses full. Extremities were warm and pink.  Capillary refill was brisk.  Skin:  Clear, warm and dry.  No rash. Neuro:  Alert and oriented times 3.  Muscle strength was normal.  Sensation was intact to light touch.   Labs:   Results for orders placed during the hospital encounter of 12/09/11  URIC ACID      Component Value Range   Uric Acid, Serum 5.0  2.4 - 7.0 (mg/dL)     Radiology:  Dg Knee Complete 4 Views Left  12/09/2011  *RADIOLOGY REPORT*  Clinical Data:  Knee pain  LEFT KNEE - COMPLETE 4+ VIEW  Comparison: None.  Findings: No acute fracture and no dislocation.  Large joint effusion is noted. Minimal degenerative change of the medial compartment.  IMPRESSION: No acute bony injury.  Joint effusion.  Original Report Authenticated By: Donavan Burnet, M.D.    Medications given in U[facility]:  None  Assessment:   Diagnoses that have been ruled out:  Diagnoses that are still under consideration:  Final diagnoses:  Gout    Plan:   1.  The following meds were prescribed:   New Prescriptions   INDOMETHACIN (INDOCIN) 50 MG CAPSULE    Take 1 capsule (50 mg total) by mouth 2 (two) times daily with a meal.   2.  The patient was instructed in symptomatic care, including rest and activity, elevation, application of ice and compression.  Appropriate handouts were given. 3.  The patient was told to return if becoming worse in any way, if no better in 3 or 4 days, and given some red flag symptoms that would indicate earlier return.   4.  The patient was told to follow up with her primary care physician in a week.   Roque Lias, MD 12/09/11 [phone]

## 2011-12-12 ENCOUNTER — Other Ambulatory Visit: Payer: Self-pay | Admitting: Family Medicine

## 2011-12-12 ENCOUNTER — Encounter: Payer: Self-pay | Admitting: Family Medicine

## 2011-12-12 ENCOUNTER — Encounter: Payer: Self-pay | Admitting: *Deleted

## 2011-12-12 ENCOUNTER — Ambulatory Visit (INDEPENDENT_AMBULATORY_CARE_PROVIDER_SITE_OTHER): Payer: 59 | Admitting: Family Medicine

## 2011-12-12 VITALS — BP 132/80 | HR 76 | Temp 98.4°F | Wt 172.5 lb

## 2011-12-12 DIAGNOSIS — M25469 Effusion, unspecified knee: Secondary | ICD-10-CM

## 2011-12-12 DIAGNOSIS — M25462 Effusion, left knee: Secondary | ICD-10-CM | POA: Insufficient documentation

## 2011-12-12 LAB — CBC WITH DIFFERENTIAL/PLATELET
Basophils Absolute: 0.1 10*3/uL (ref 0.0–0.1)
Eosinophils Absolute: 0.1 10*3/uL (ref 0.0–0.7)
Hemoglobin: 13 g/dL (ref 12.0–15.0)
Lymphocytes Relative: 22 % (ref 12.0–46.0)
MCHC: 33.3 g/dL (ref 30.0–36.0)
Neutro Abs: 4.9 10*3/uL (ref 1.4–7.7)
RDW: 14 % (ref 11.5–14.6)

## 2011-12-12 NOTE — Patient Instructions (Signed)
Return early next week for recheck. Continue indocin, ice, elevation of knee. Blood work today as well as aspiration. Doesn't sound like gout.

## 2011-12-12 NOTE — Progress Notes (Signed)
  Subjective:    Patient ID: Julie Baker, female    DOB: 1956/02/09, 56 y.o.   MRN: [id number]  HPI CC: knee pain, f/u [facility]  Monday night left knee swelling and painful, used ice to improve pain.  No redness.  Did get warm and swollen and tender.  Trouble fully flexing/extending knee.  Also soreness in calf with walking.  No knee problems in past.  No other joint problems.  L elbow tender at times.  No new rashes, fevers/chills, abd pain, n/v.  Did hit knee on machine at work 1 week prior to swelling.    Tuesday went to [facility], saw Dr. Lorenz Coaster, thought may be gout from eating too much red meat and shellfish recently.  xrays negative.  Records reviewed from [facility].  UA level was 5.0.  Placed on idomethacin.  Knee swelling and soreness has improved some.  Did do more walking day prior to knee swelling.  On birth control.  No prolonged period of immobility.  No smokers at home.  Review of Systems Per HPI    Objective:   Physical Exam  Nursing note and vitals reviewed. Constitutional: She appears well-developed and well-nourished. No distress.  HENT:  Head: Normocephalic and atraumatic.  Musculoskeletal: She exhibits no edema.       Right knee: Normal.       Left knee: She exhibits decreased range of motion, swelling and effusion (mild).       R knee WNL L knee no erythema, + calor and mild edema Neg mcmurray and drawer test. Tender medial and lateral joint lines  Skin: Skin is warm and dry. No rash noted.  Psychiatric: She has a normal mood and affect.   Left knee aspiration performed: skin sterilized with betadine then ethyl chloride used and with knee extended and using approach superior and medial to patella, 1" 22g needle used to aspirate 5cc of serous slightly sanguinous fluid.  Pt tolerated procedure well.  Knee wrapped with kerlex afterwards.    Assessment & Plan:

## 2011-12-12 NOTE — Assessment & Plan Note (Addendum)
Given continued swelling and warmth, aspirated knee. Doubt gout as no significant erythema, UA 5.0. Doubt infectious cause as improving off abx however have sent off fluid culture/cell count, crystal. Anticipate CPPD vs traumatic effusion after hitting knee on machine. Pt requests note for work, asks [place] to fill out FMLA form. rec continue indocin, ice knee and elevate knee. If fever, worsening pain or redness spreading or warmth, to notify us or seek care over weekend.

## 2011-12-13 LAB — SYNOVIAL CELL COUNT + DIFF, W/ CRYSTALS
Crystals, Fluid: NONE SEEN
Eosinophils-Synovial: 0 % (ref 0–1)
WBC, Synovial: 4770 cu mm — ABNORMAL HIGH (ref 0–200)

## 2011-12-16 ENCOUNTER — Encounter: Payer: Self-pay | Admitting: Family Medicine

## 2011-12-16 ENCOUNTER — Encounter: Payer: Self-pay | Admitting: *Deleted

## 2011-12-16 ENCOUNTER — Ambulatory Visit (INDEPENDENT_AMBULATORY_CARE_PROVIDER_SITE_OTHER): Payer: 59 | Admitting: Family Medicine

## 2011-12-16 VITALS — BP 136/82 | HR 80 | Temp 98.7°F | Wt 172.5 lb

## 2011-12-16 DIAGNOSIS — M25462 Effusion, left knee: Secondary | ICD-10-CM

## 2011-12-16 DIAGNOSIS — M25469 Effusion, unspecified knee: Secondary | ICD-10-CM

## 2011-12-16 LAB — BODY FLUID CULTURE: Gram Stain: NONE SEEN

## 2011-12-16 MED ORDER — DIOVAN HCT 160-12.5 MG PO TABS: 1.0000 | ORAL_TABLET | Freq: Every day | ORAL | Status: DC

## 2011-12-16 NOTE — Progress Notes (Signed)
  Subjective:    Patient ID: Julie Baker, female    DOB: [birth year], 56 y.o.   MRN: [id number]  HPI CC: f/u L knee   Seen here 12/12/11 with L knee effusion, aspirated and continued indomethacin bid.    Swelling has gone down.  Tightness staying in leg.  Tenderness improving but still present.  Able to walk better.  Able to flex/extend knee better.  No redness.  Warmth resolving.  Elevating and icing knee during day, soaking in salt water at night.  HTN - requests to stay on diovan brand because worried about worsened BPcontrol with generic.  Review of Systems Per HPI    Objective:   Physical Exam  Nursing note and vitals reviewed. Constitutional: She is oriented to person, place, and time. She appears well-developed and well-nourished. No distress.  HENT:  Head: Normocephalic and atraumatic.  Musculoskeletal:       Right knee: Normal.       Left knee: She exhibits swelling and effusion. She exhibits normal range of motion.       R knee WNL L knee: improved ROM, still tightness at end flexion. Mild tenderness to palpation slightly superior to lateral joint line. + warmth and smaller effusion present but no erythema No significant crepitus  Neurological: She is alert and oriented to person, place, and time. She has normal reflexes.       Assessment & Plan:

## 2011-12-16 NOTE — Assessment & Plan Note (Addendum)
Reviewed blood work and fluid analysis with pt. Anticipate traumatic inflammatory arthritis after hit knee at work on machine. Fluid analysis negative for crystals or septic joint. Continue NSAID as well as elevation, ice. Out of work for this week, return to work next week. RTC PRN.

## 2011-12-16 NOTE — Patient Instructions (Signed)
I think you had inflammatory joint swelling, not due to bacteria. Continue icing, elevating leg during day, indocin as needed for discomfort. Should continue to improve each day. Out of work for this week.  Return to work next week. We filled out FMLA form. Good to see you today, call us with questions.

## 2011-12-18 ENCOUNTER — Telehealth: Payer: Self-pay | Admitting: *Deleted

## 2011-12-18 NOTE — Telephone Encounter (Signed)
Received form needing completion for time out of work. In your IN box.

## 2011-12-21 NOTE — Telephone Encounter (Addendum)
Filled and placed inKim's box. Please check with pt ok - I said L knee swelling possibly due to injury sustained at work.

## 2011-12-22 NOTE — Telephone Encounter (Signed)
Message left for patient to return my call.  

## 2011-12-23 NOTE — Telephone Encounter (Signed)
Patient came by office and paperwork explained to patient. She verbalized understanding. Copies of forms given to patient to turn into her employer.

## 2012-02-05 ENCOUNTER — Encounter: Payer: Self-pay | Admitting: Family Medicine

## 2012-02-05 ENCOUNTER — Ambulatory Visit (INDEPENDENT_AMBULATORY_CARE_PROVIDER_SITE_OTHER): Payer: 59 | Admitting: Family Medicine

## 2012-02-05 VITALS — BP 118/80 | HR 64 | Temp 97.9°F | Wt 174.8 lb

## 2012-02-05 DIAGNOSIS — M25462 Effusion, left knee: Secondary | ICD-10-CM

## 2012-02-05 DIAGNOSIS — M25469 Effusion, unspecified knee: Secondary | ICD-10-CM

## 2012-02-05 MED ORDER — NAPROXEN 500 MG PO TABS: ORAL_TABLET | ORAL | Status: AC

## 2012-02-05 NOTE — Patient Instructions (Signed)
Try to avoid bending/squatting at work. Use brace while at work.  When at home, elevate leg, ice, and rest. Use anti inflammatory as prescribed.  With food twice daily for 1 week then as needed. Let us know if not improving as expected.

## 2012-02-05 NOTE — Assessment & Plan Note (Signed)
Prior thought traumatic inflammatory arthritis.  anticipate same. Doubt meniscal injury. If recurrent, refer to ortho for further eval. If worsening, return, consider re aspiration. For now treat with ice, rest, elevation at home, brace during work, and naprosyn scheduled for 1 wk.

## 2012-02-05 NOTE — Progress Notes (Signed)
  Subjective:    Patient ID: Julie Baker, female    DOB: [birth year], 56 y.o.   MRN: [id number]  HPI CC: I think I irritated my left knee  Dragging barrel at work, may have tweaked knee.  Repetitive squatting and bending.  Working overtime last several days.  L knee started bothering her 3 days ago.  Saw RN at work.  Given brace to wear at work.  Icing at home.  Taking aspirin as well.  Pain localized to lateral and superior to knee.  No radiation.  No popping.  Some instability of knee.  12/12/2011 - seen here with aspiration of L knee joint, thought traumatic inflammatory arthritis after hit knee at work on machine.  Reviewed report of xray L knee 12/09/2011 - large effusion, no dislocation, minimal medial compartent DJD.  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 Musculoskeletal:       Right knee: Normal.       Left knee: She exhibits decreased range of motion and effusion. She exhibits no deformity, no erythema, normal alignment, no LCL laxity, normal patellar mobility, normal meniscus and no MCL laxity. tenderness found. Lateral joint line and LCL tenderness noted. No medial joint line, no MCL and no patellar tendon tenderness noted.       Limited ROM in flexion 2/2 tightness. Effusion present. Neg McMurray's, drawer. No pain with valgus/varus testing. Neg PFgrind.  Skin: Skin is warm and dry. No rash noted.  Psychiatric: She has a normal mood and affect.       Assessment & Plan:

## 2012-02-06 ENCOUNTER — Telehealth: Payer: Self-pay | Admitting: Family Medicine

## 2012-02-06 NOTE — Telephone Encounter (Signed)
Patient was seen for knee pain yesterday.  Patient said she went to work yesterday,but it was more difficult than she thought with her knee. Her knee is swollen. She's asking for a note to be out of work until March 18th. She needs a reply before 3:00 today.

## 2012-02-06 NOTE — Telephone Encounter (Signed)
Pt called back, says to cancel this request. Says she will try to go into work.  If she see that she can not she will call back.Marland Kitchen

## 2012-02-06 NOTE — Telephone Encounter (Signed)
Noted thanks °

## 2012-03-23 ENCOUNTER — Encounter: Payer: Self-pay | Admitting: Family Medicine

## 2012-03-23 ENCOUNTER — Ambulatory Visit (INDEPENDENT_AMBULATORY_CARE_PROVIDER_SITE_OTHER): Payer: 59 | Admitting: Family Medicine

## 2012-03-23 VITALS — BP 114/70 | HR 66 | Temp 97.5°F | Wt 172.2 lb

## 2012-03-23 DIAGNOSIS — J069 Acute upper respiratory infection, unspecified: Secondary | ICD-10-CM | POA: Insufficient documentation

## 2012-03-23 MED ORDER — HYDROCODONE-HOMATROPINE 5-1.5 MG/5ML PO SYRP: 5.0000 mL | ORAL_SOLUTION | Freq: Every evening | ORAL | Status: AC | PRN

## 2012-03-23 NOTE — Assessment & Plan Note (Signed)
Anticipate viral. Supportive care as per instructions. Update [place] if not improving as expected. Hydrocodone cough syrup as needed.

## 2012-03-23 NOTE — Patient Instructions (Signed)
Sounds like you have an upper respiratory infection, likely viral. Antibiotics are not needed for this.  Viral infections usually take 7-10 days to resolve.  The cough can last a few weeks to go away. Use medication as prescribed: hydrocodone cough syrup for cough at night. Push fluids and plenty of rest. Simple mucinex or guaifenesin with plenty of fluid to mobilize mucous. Please let us know if you are not improving as expected, or if you have high fevers (>101.5) or difficulty swallowing or worsening productive cough. Call clinic with questions.  Good to see you today.

## 2012-03-23 NOTE — Progress Notes (Signed)
  Subjective:    Patient ID: Julie Baker, female    DOB: 18-Mar-1956, 56 y.o.   MRN: [id number]  HPI CC: cough  7d h/o cough worse at night productive of green/white sputum which seems to be improving.  Started with scratchy throat.  Staying red.  Subjective fever on Friday.  Cough keeping her up at night.  Tried OTC cough medicines but hasn't helped.  No fevers/chills, coryza, sinus pressure, abd pain, HA, ear or tooth pain.  No new rashes.  No myalgia/arthralgias.  No smokers at home.  Exposed to smoke at work.  No sick contacts at home.  No h/o asthma/COPD.  No h/o allergic rhinitis.  Past Medical History  Diagnosis Date  . Hypertension   . Migraines      Review of Systems Per HPI    Objective:   Physical Exam  Nursing note and vitals reviewed. Constitutional: She appears well-developed and well-nourished. No distress.  HENT:  Head: Normocephalic and atraumatic.  Right Ear: Hearing, tympanic membrane, external ear and ear canal normal.  Left Ear: Hearing, tympanic membrane, external ear and ear canal normal.  Nose: No mucosal edema or rhinorrhea. Right sinus exhibits no maxillary sinus tenderness and no frontal sinus tenderness. Left sinus exhibits no maxillary sinus tenderness and no frontal sinus tenderness.  Mouth/Throat: Uvula is midline, oropharynx is clear and moist and mucous membranes are normal. No oropharyngeal exudate, posterior oropharyngeal edema, posterior oropharyngeal erythema or tonsillar abscesses.       Pale turbinates  Eyes: Conjunctivae and EOM are normal. Pupils are equal, round, and reactive to light. No scleral icterus.  Neck: Normal range of motion. Neck supple.  Cardiovascular: Normal rate, regular rhythm, normal heart sounds and intact distal pulses.   No murmur heard. Pulmonary/Chest: Effort normal and breath sounds normal. No respiratory distress. She has no wheezes. She has no rales.  Lymphadenopathy:    She has no cervical adenopathy.  Skin:  Skin is warm and dry. No rash noted.       Assessment & Plan:

## 2012-04-08 ENCOUNTER — Other Ambulatory Visit: Payer: Self-pay | Admitting: Family Medicine

## 2012-04-22 ENCOUNTER — Other Ambulatory Visit: Payer: Self-pay | Admitting: Family Medicine

## 2012-04-22 DIAGNOSIS — I10 Essential (primary) hypertension: Secondary | ICD-10-CM

## 2012-04-22 DIAGNOSIS — E7800 Pure hypercholesterolemia, unspecified: Secondary | ICD-10-CM

## 2012-04-28 ENCOUNTER — Other Ambulatory Visit (INDEPENDENT_AMBULATORY_CARE_PROVIDER_SITE_OTHER): Payer: 59

## 2012-04-28 DIAGNOSIS — I10 Essential (primary) hypertension: Secondary | ICD-10-CM

## 2012-04-28 DIAGNOSIS — E7800 Pure hypercholesterolemia, unspecified: Secondary | ICD-10-CM

## 2012-04-28 LAB — COMPREHENSIVE METABOLIC PANEL
Albumin: 3.5 g/dL (ref 3.5–5.2)
Alkaline Phosphatase: 54 U/L (ref 39–117)
BUN: 13 mg/dL (ref 6–23)
CO2: 25 mEq/L (ref 19–32)
GFR: 67.8 mL/min (ref >60.00)
Glucose, Bld: 90 mg/dL (ref 70–99)
Potassium: 3.7 mEq/L (ref 3.5–5.1)
Sodium: 141 mEq/L (ref 135–145)
Total Protein: 6.7 g/dL (ref 6.0–8.3)

## 2012-04-28 LAB — LIPID PANEL
Cholesterol: 178 mg/dL (ref 0–200)
HDL: 52.3 mg/dL (ref >39.00)
VLDL: 11.4 mg/dL (ref 0.0–40.0)

## 2012-05-03 ENCOUNTER — Ambulatory Visit (INDEPENDENT_AMBULATORY_CARE_PROVIDER_SITE_OTHER): Payer: 59 | Admitting: Family Medicine

## 2012-05-03 ENCOUNTER — Other Ambulatory Visit (HOSPITAL_COMMUNITY)
Admission: RE | Admit: 2012-05-03 | Discharge: 2012-05-03 | Disposition: A | Discharge status: Home / Self Care | Payer: 59 | Source: Ambulatory Visit | Attending: Family Medicine | Admitting: Family Medicine

## 2012-05-03 ENCOUNTER — Encounter: Payer: Self-pay | Admitting: Family Medicine

## 2012-05-03 VITALS — BP 118/72 | HR 72 | Temp 97.9°F | Ht 62.0 in | Wt 172.8 lb

## 2012-05-03 DIAGNOSIS — R011 Cardiac murmur, unspecified: Secondary | ICD-10-CM

## 2012-05-03 DIAGNOSIS — Z01419 Encounter for gynecological examination (general) (routine) without abnormal findings: Secondary | ICD-10-CM | POA: Insufficient documentation

## 2012-05-03 DIAGNOSIS — Z1231 Encounter for screening mammogram for malignant neoplasm of breast: Secondary | ICD-10-CM

## 2012-05-03 DIAGNOSIS — E7800 Pure hypercholesterolemia, unspecified: Secondary | ICD-10-CM

## 2012-05-03 DIAGNOSIS — Z0000 Encounter for general adult medical examination without abnormal findings: Secondary | ICD-10-CM | POA: Insufficient documentation

## 2012-05-03 DIAGNOSIS — I10 Essential (primary) hypertension: Secondary | ICD-10-CM

## 2012-05-03 DIAGNOSIS — R059 Cough, unspecified: Secondary | ICD-10-CM | POA: Insufficient documentation

## 2012-05-03 DIAGNOSIS — R05 Cough: Secondary | ICD-10-CM

## 2012-05-03 DIAGNOSIS — Z1211 Encounter for screening for malignant neoplasm of colon: Secondary | ICD-10-CM

## 2012-05-03 NOTE — Progress Notes (Signed)
Subjective:    Patient ID: Julie Baker, female    DOB: 06-14-1956, 56 y.o.   MRN: [id number]  HPI CC: CPE  Seen here 03/23/2012 with dx viral URTI with cough.  Cough hasn't resolved.  Cough worse when pt feels hot.  No fevers/chills.  Minimally productive.  occasional coughing fits.  Denies ST, congestion, RN.  Occasional chest tightness with cough.  No h/o asthma, COPD.  Denies allergic rhinitis (RN, sneezing, itchy eyes).  Occasional chest burning with certain foods.  HTN - compliant with atenolol 100mg  daily and diovan hct 160/12.5mg  daily.    HLD - stable off meds. Lab Results  Component Value Date   CHOL 178 04/28/2012   HDL 52.30 04/28/2012   LDLCALC 114* 04/28/2012   TRIG 57.0 04/28/2012   CHOLHDL 3 04/28/2012   LMP last week - irregular.  Perimenopausal. Body mass index is 31.60 kg/(m^2).  Preventative: Colon screening - normal stool kits in past.   Mammogram - last 05/13/2011 (40mo f/u for abnormality R breast, returned normal, rec rpt 1 yr).  Would like [place] to schedule.  Goes to breast center. Pap smear - done last year and normal.  Never abnormal.  +fmhx ovarian (mother) and uterine cancer (aunt) Tetanus 2009 Flu shot - does not get  Caffeine: 1 cup coffee/day Married, lives with husband. Occupation: [employer] Activity: backed off on walking (was 2-3 times a week) Diet: moderate, eats some junk, chips, fast food, eats out a lot.  Fruits and veggies occ. Some water. No calcium.  Medications and allergies reviewed and updated in chart.  Past histories reviewed and updated if relevant as below. Patient Active Problem List  Diagnoses  . HYPERCHOLESTEROLEMIA, PURE  . MIGRAINE, COMMON  . HYPERTENSION  . SYSTOLIC MURMUR  . MAMMOGRAM, ABNORMAL, RIGHT  . Swelling of left knee joint   Past Medical History  Diagnosis Date  . Hypertension   . Migraines    No past surgical history on file. History  Substance Use Topics  . Smoking status: Never Smoker   .  Smokeless tobacco: Never Used  . Alcohol Use: No   Family History  Problem Relation Age of Onset  . Cancer Mother     ovarian, recurrence  . Hypertension Brother   . Cancer Maternal Aunt     uterine cancer  . Cancer Paternal Grandfather     unsure type  . Coronary artery disease Neg Hx   . Stroke Maternal Grandmother   . Diabetes Maternal Uncle     s/p leg amputation  . Diabetes Paternal Aunt    No Known Allergies Current Outpatient Prescriptions on File Prior to Visit  Medication Sig Dispense Refill  . atenolol (TENORMIN) 100 MG tablet TAKE 1 TABLET BY MOUTH EVERY DAY  30 tablet  5  . DIOVAN HCT 160-12.5 MG per tablet Take 1 tablet by mouth daily.  30 tablet  5  . naproxen (NAPROSYN) 500 MG tablet Take one po bid x 1 week then prn pain, take with food  60 tablet  0  . NORTREL 7/7/7 0.5/0.75/1-35 MG-MCG tablet TAKE 1 TABLET BY MOUTH EVERY DAY AS DIRECTED  84 tablet  2  . SUMAtriptan (IMITREX) 100 MG tablet Take one by mouth as directed as needed       . topiramate (TOPAMAX) 25 MG capsule Take 3 by mouth at night           Review of Systems  Constitutional: Negative for fever, chills, activity change, appetite change,  fatigue and unexpected weight change.  HENT: Negative for hearing loss and neck pain.   Eyes: Negative for visual disturbance.  Respiratory: Positive for cough. Negative for chest tightness, shortness of breath and wheezing.   Cardiovascular: Negative for chest pain, palpitations and leg swelling.  Gastrointestinal: Negative for nausea, vomiting, abdominal pain, diarrhea, constipation, blood in stool and abdominal distention.  Genitourinary: Negative for hematuria and difficulty urinating.  Musculoskeletal: Negative for myalgias and arthralgias.  Skin: Negative for rash.  Neurological: Negative for dizziness, seizures, syncope and headaches.  Hematological: Does not bruise/bleed easily.  Psychiatric/Behavioral: Negative for dysphoric mood. The patient is not  nervous/anxious.        Objective:   Physical Exam  Nursing note and vitals reviewed. Constitutional: She is oriented to person, place, and time. She appears well-developed and well-nourished. No distress.  HENT:  Head: Normocephalic and atraumatic.  Right Ear: Hearing, tympanic membrane, external ear and ear canal normal.  Left Ear: Hearing, tympanic membrane, external ear and ear canal normal.  Nose: Nose normal.  Mouth/Throat: Oropharynx is clear and moist. No oropharyngeal exudate.  Eyes: Conjunctivae and EOM are normal. Pupils are equal, round, and reactive to light. No scleral icterus.  Neck: Normal range of motion. Neck supple. No thyromegaly present.  Cardiovascular: Normal rate, regular rhythm, normal heart sounds and intact distal pulses.   No murmur heard. Pulses:      Radial pulses are 2+ on the right side, and 2+ on the left side.  Pulmonary/Chest: Effort normal and breath sounds normal. No respiratory distress. She has no wheezes. She has no rales. Right breast exhibits no inverted nipple, no mass, no nipple discharge, no skin change and no tenderness. Left breast exhibits no inverted nipple, no mass, no nipple discharge, no skin change and no tenderness. Breasts are symmetrical.  Abdominal: Soft. Bowel sounds are normal. She exhibits no distension and no mass. There is no tenderness. There is no rebound and no guarding.  Genitourinary: Uterus normal. Pelvic exam was performed with patient supine. There is no rash, tenderness or lesion on the right labia. There is no rash, tenderness or lesion on the left labia. Uterus is not deviated, not enlarged, not fixed and not tender. Cervix exhibits no motion tenderness, no discharge and no friability. Right adnexum displays no mass, no tenderness and no fullness. Left adnexum displays no mass, no tenderness and no fullness. No erythema, tenderness or bleeding around the vagina. No signs of injury around the vagina. No vaginal discharge  found.  Musculoskeletal: Normal range of motion. She exhibits no edema.  Lymphadenopathy:    She has no cervical adenopathy.    She has no axillary adenopathy.       Right axillary: No lateral adenopathy present.       Left axillary: No lateral adenopathy present.      Right: No supraclavicular adenopathy present.       Left: No supraclavicular adenopathy present.  Neurological: She is alert and oriented to person, place, and time.       CN grossly intact, station and gait intact  Skin: Skin is warm and dry. No rash noted.  Psychiatric: She has a normal mood and affect. Her behavior is normal. Judgment and thought content normal.      Assessment & Plan:

## 2012-05-03 NOTE — Assessment & Plan Note (Signed)
Did not appreciate today. 

## 2012-05-03 NOTE — Assessment & Plan Note (Signed)
Chronic, stable. continue meds. 

## 2012-05-03 NOTE — Assessment & Plan Note (Signed)
Residual after URI.  Going on 6-7 wks now (started mid April).  Doubt allergic rhinitis related.  However may be GERD related - trial of omeprazole samples.  If not better, return for further evaluation.  Hycodan did not help at all.

## 2012-05-03 NOTE — Patient Instructions (Addendum)
Make sure your orange juice is calcium fortified.  Goal 1200mg  of calcium a day (1 glass of 8oz is 300mg ).  Goal vitamin D is 800 IU daily.  Consider supplementation with calcium 600/vit D 400 pills if not reaching goal. For cough - try samples of prilosec as this could be reflux related.  If not resolving, please let me know. I recommend yearly pelvic exam and mammogram. Pass by [name]'s office to schedule mammogram. Stool kit today. Good to see you today, call us with questions.

## 2012-05-03 NOTE — Assessment & Plan Note (Addendum)
Preventative protocols reviewed and updated unless pt declined. Discussed healthy diet and lifestyle. rec calcium/vit D supplementation. rec increase activity for weight loss and healthy living. Given family history, recommend yearly pelvic exams. May space out pap if normal.

## 2012-05-03 NOTE — Assessment & Plan Note (Signed)
Mild, diet controlled  

## 2012-05-07 ENCOUNTER — Encounter: Payer: Self-pay | Admitting: *Deleted

## 2012-05-11 ENCOUNTER — Other Ambulatory Visit: Payer: 59

## 2012-05-11 ENCOUNTER — Other Ambulatory Visit: Payer: Self-pay | Admitting: Family Medicine

## 2012-05-11 DIAGNOSIS — Z1211 Encounter for screening for malignant neoplasm of colon: Secondary | ICD-10-CM

## 2012-05-11 LAB — FECAL OCCULT BLOOD, IMMUNOCHEMICAL: Fecal Occult Bld: NEGATIVE

## 2012-05-12 ENCOUNTER — Encounter: Payer: Self-pay | Admitting: *Deleted

## 2012-05-17 ENCOUNTER — Ambulatory Visit
Admission: RE | Admit: 2012-05-17 | Discharge: 2012-05-17 | Disposition: A | Discharge status: Home / Self Care | Payer: 59 | Source: Ambulatory Visit | Attending: Family Medicine | Admitting: Family Medicine

## 2012-05-17 DIAGNOSIS — Z1231 Encounter for screening mammogram for malignant neoplasm of breast: Secondary | ICD-10-CM

## 2012-05-18 ENCOUNTER — Encounter: Payer: Self-pay | Admitting: *Deleted

## 2012-05-28 ENCOUNTER — Other Ambulatory Visit: Payer: Self-pay | Admitting: Family Medicine

## 2012-05-28 NOTE — Telephone Encounter (Signed)
[facility] did not get refill sent 05/11/12. Given while on phone to [place] at [facility].

## 2012-06-21 ENCOUNTER — Other Ambulatory Visit: Payer: Self-pay | Admitting: Family Medicine

## 2012-10-05 ENCOUNTER — Other Ambulatory Visit: Payer: Self-pay | Admitting: Family Medicine

## 2012-11-02 ENCOUNTER — Encounter: Payer: Self-pay | Admitting: Family Medicine

## 2012-11-02 ENCOUNTER — Ambulatory Visit (INDEPENDENT_AMBULATORY_CARE_PROVIDER_SITE_OTHER): Payer: 59 | Admitting: Family Medicine

## 2012-11-02 VITALS — BP 118/76 | HR 76 | Temp 98.1°F | Wt 171.2 lb

## 2012-11-02 DIAGNOSIS — N951 Menopausal and female climacteric states: Secondary | ICD-10-CM

## 2012-11-02 DIAGNOSIS — Z1331 Encounter for screening for depression: Secondary | ICD-10-CM

## 2012-11-02 DIAGNOSIS — Z780 Asymptomatic menopausal state: Secondary | ICD-10-CM | POA: Insufficient documentation

## 2012-11-02 DIAGNOSIS — I10 Essential (primary) hypertension: Secondary | ICD-10-CM

## 2012-11-02 NOTE — Assessment & Plan Note (Signed)
Chronic, stable. Continue meds. 

## 2012-11-02 NOTE — Patient Instructions (Signed)
Good to see you today, call us with questions. Return in 6 mo for physical.

## 2012-11-02 NOTE — Assessment & Plan Note (Signed)
Discussed menopause and sxs to expect.  Recheck pelvic exam in 6 mo.

## 2012-11-02 NOTE — Progress Notes (Signed)
  Subjective:    Patient ID: Julie Baker, female    DOB: 05-10-1956, 56 y.o.   MRN: [id number]  HPI CC: 6 mo f/u  HTN - No HA, vision changes, CP/tightness, SOB, leg swelling.   Compliant with med and tolerating well.  Mild HLD - diet controlled.  LMP - 2 wks ago.  Staying irregular.  Short periods.  On nortrel.  Over last more irregular.  Denies any hot flashes.  Denies bleeding between periods.  fmhx ovarian and uterine cancer.  Denies depression, anhedonia, sadness.  Review of Systems Per HPI    Objective:   Physical Exam  Nursing note and vitals reviewed. Constitutional: She appears well-developed and well-nourished. No distress.  HENT:  Head: Normocephalic and atraumatic.  Mouth/Throat: Oropharynx is clear and moist. No oropharyngeal exudate.  Eyes: Conjunctivae normal and EOM are normal. Pupils are equal, round, and reactive to light. No scleral icterus.  Neck: Normal range of motion. Neck supple. Carotid bruit is not present.  Cardiovascular: Normal rate, regular rhythm and intact distal pulses.   Murmur (3/6 SEM) heard. Pulmonary/Chest: Effort normal and breath sounds normal. No respiratory distress. She has no wheezes. She has no rales.  Musculoskeletal: She exhibits no edema.  Lymphadenopathy:    She has no cervical adenopathy.  Skin: Skin is warm and dry.       Assessment & Plan:  Declines flu shot.

## 2012-11-03 ENCOUNTER — Other Ambulatory Visit: Payer: Self-pay | Admitting: Family Medicine

## 2012-11-29 ENCOUNTER — Other Ambulatory Visit: Payer: Self-pay | Admitting: Family Medicine

## 2012-12-06 ENCOUNTER — Other Ambulatory Visit: Payer: Self-pay | Admitting: Family Medicine

## 2013-02-03 ENCOUNTER — Other Ambulatory Visit: Payer: Self-pay | Admitting: Family Medicine

## 2013-04-19 ENCOUNTER — Other Ambulatory Visit: Payer: Self-pay | Admitting: Family Medicine

## 2013-04-19 DIAGNOSIS — I10 Essential (primary) hypertension: Secondary | ICD-10-CM

## 2013-04-19 DIAGNOSIS — E7800 Pure hypercholesterolemia, unspecified: Secondary | ICD-10-CM

## 2013-04-20 ENCOUNTER — Other Ambulatory Visit: Payer: Self-pay | Admitting: Family Medicine

## 2013-04-26 ENCOUNTER — Other Ambulatory Visit (INDEPENDENT_AMBULATORY_CARE_PROVIDER_SITE_OTHER): Payer: 59

## 2013-04-26 DIAGNOSIS — I10 Essential (primary) hypertension: Secondary | ICD-10-CM

## 2013-04-26 DIAGNOSIS — E7800 Pure hypercholesterolemia, unspecified: Secondary | ICD-10-CM

## 2013-04-26 LAB — LIPID PANEL
LDL Cholesterol: 119 mg/dL — ABNORMAL HIGH (ref 0–99)
Total CHOL/HDL Ratio: 4
VLDL: 15.4 mg/dL (ref 0.0–40.0)

## 2013-04-26 LAB — BASIC METABOLIC PANEL
Chloride: 107 mEq/L (ref 96–112)
GFR: 78.33 mL/min (ref >60.00)
Potassium: 3.7 mEq/L (ref 3.5–5.1)
Sodium: 139 mEq/L (ref 135–145)

## 2013-05-03 ENCOUNTER — Encounter: Payer: Self-pay | Admitting: Family Medicine

## 2013-05-03 ENCOUNTER — Other Ambulatory Visit: Payer: Self-pay

## 2013-05-03 ENCOUNTER — Ambulatory Visit (INDEPENDENT_AMBULATORY_CARE_PROVIDER_SITE_OTHER): Payer: 59 | Admitting: Family Medicine

## 2013-05-03 VITALS — BP 120/70 | HR 60 | Temp 98.2°F | Ht 62.25 in | Wt 168.2 lb

## 2013-05-03 DIAGNOSIS — Z1211 Encounter for screening for malignant neoplasm of colon: Secondary | ICD-10-CM

## 2013-05-03 DIAGNOSIS — Z1231 Encounter for screening mammogram for malignant neoplasm of breast: Secondary | ICD-10-CM

## 2013-05-03 DIAGNOSIS — Z0000 Encounter for general adult medical examination without abnormal findings: Secondary | ICD-10-CM

## 2013-05-03 NOTE — Patient Instructions (Signed)
Stool kit today. Pelvic exam today without pap smear. Call to schedule mammogram.

## 2013-05-03 NOTE — Assessment & Plan Note (Addendum)
Preventative protocols reviewed and updated unless pt declined. Discussed healthy diet and lifestyle.  Discussed cal/vit D supplementation Given fam hx, rec yearly pelvic exams. Will space out pap to Q2 yrs. Reviewed blood work #s with patient.

## 2013-05-03 NOTE — Progress Notes (Signed)
Subjective:    Patient ID: Julie Baker, female    DOB: [birth year], 57 y.o.   MRN: [id number]  HPI CC: CPE  LMP last week - irregular. Perimenopausal. Gets periods monthly.  Menarche at age 51 yo. Mother going through cancer. Body mass index is 30.53 kg/(m^2).  Preventative:  Colon screening - normal stool kits in past. Requests continued. Mammogram - last 05/13/2011 (53mo f/u for abnormality R breast, returned normal, rec rpt 1 yr). Would like [place] to schedule. Goes to breast center.  Pap smear - done last year and normal.  Never abnormal.  +fmhx ovarian (mother) and uterine cancer (aunt). Tetanus 2009.  Flu shot - does not get.  Caffeine: 1 cup coffee/day  Married, lives with husband.  Occupation: [employer]  Activity: some yardwork, wants to restart walking Diet: moderate, eats some junk, chips, fast food, eats out a lot. Daily fruits and veggies. Good water.  Some dairy, no milk.  Medications and allergies reviewed and updated in chart.  Past histories reviewed and updated if relevant as below. Patient Active Problem List   Diagnosis Date Noted  . Perimenopausal 11/02/2012  . Healthcare maintenance 05/03/2012  . Swelling of left knee joint 12/12/2011  . SYSTOLIC MURMUR 04/18/2009  . HYPERCHOLESTEROLEMIA, PURE 04/13/2007  . MIGRAINE, COMMON 04/07/2007  . HYPERTENSION 04/07/2007   Past Medical History  Diagnosis Date  . Hypertension   . Migraines   . HLD (hyperlipidemia)     mild   No past surgical history on file. History  Substance Use Topics  . Smoking status: Never Smoker   . Smokeless tobacco: Never Used  . Alcohol Use: No   Family History  Problem Relation Age of Onset  . Cancer Mother     ovarian, recurrence  . Hypertension Brother   . Cancer Maternal Aunt     uterine cancer  . Cancer Paternal Grandfather     unsure type  . Coronary artery disease Neg Hx   . Stroke Maternal Grandmother   . Diabetes Maternal Uncle     s/p leg amputation  .  Diabetes Paternal Aunt    No Known Allergies Current Outpatient Prescriptions on File Prior to Visit  Medication Sig Dispense Refill  . atenolol (TENORMIN) 100 MG tablet TAKE 1 TABLET BY MOUTH EVERY DAY  30 tablet  5  . DIOVAN HCT 160-12.5 MG per tablet TAKE 1 TABLET BY MOUTH EVERY DAY  30 tablet  11  . NORTREL 7/7/7 0.5/0.75/1-35 MG-MCG tablet TAKE 1 TABLET EVERY DAY  84 tablet  2  . SUMAtriptan (IMITREX) 100 MG tablet Take one by mouth as directed as needed       . topiramate (TOPAMAX) 25 MG capsule Take 3 by mouth at night        No current facility-administered medications on file prior to visit.     Review of Systems  Constitutional: Negative for fever, chills, activity change, appetite change, fatigue and unexpected weight change.  HENT: Negative for hearing loss and neck pain.   Eyes: Negative for visual disturbance.  Respiratory: Negative for cough, chest tightness, shortness of breath and wheezing.   Cardiovascular: Negative for chest pain, palpitations and leg swelling.  Gastrointestinal: Negative for nausea, vomiting, abdominal pain, diarrhea, constipation, blood in stool and abdominal distention.  Genitourinary: Negative for hematuria and difficulty urinating.  Musculoskeletal: Negative for myalgias and arthralgias.  Skin: Negative for rash.  Neurological: Negative for dizziness, seizures, syncope and headaches.  Hematological: Negative for adenopathy. Does not bruise/bleed  easily.  Psychiatric/Behavioral: Negative for dysphoric mood. The patient is not nervous/anxious.        Objective:   Physical Exam  Nursing note and vitals reviewed. Constitutional: She is oriented to person, place, and time. She appears well-developed and well-nourished. No distress.  HENT:  Head: Normocephalic and atraumatic.  Right Ear: External ear normal.  Left Ear: External ear normal.  Nose: Nose normal.  Mouth/Throat: Oropharynx is clear and moist. No oropharyngeal exudate.  Eyes:  Conjunctivae and EOM are normal. Pupils are equal, round, and reactive to light. No scleral icterus.  Early cataracts  Neck: Normal range of motion. Neck supple. No thyromegaly present.  Cardiovascular: Normal rate, regular rhythm, normal heart sounds and intact distal pulses.   No murmur heard. Pulses:      Radial pulses are 2+ on the right side, and 2+ on the left side.  Pulmonary/Chest: Effort normal and breath sounds normal. No respiratory distress. She has no wheezes. She has no rales. Right breast exhibits no inverted nipple, no mass, no nipple discharge, no skin change and no tenderness. Left breast exhibits no inverted nipple, no mass, no nipple discharge, no skin change and no tenderness.  Abdominal: Soft. Bowel sounds are normal. She exhibits no distension and no mass. There is no tenderness. There is no rebound and no guarding.  Genitourinary: Vagina normal and uterus normal. Pelvic exam was performed with patient supine. There is no rash, tenderness, lesion or injury on the right labia. There is no rash, tenderness, lesion or injury on the left labia. Cervix exhibits no motion tenderness, no discharge and no friability. Right adnexum displays no mass, no tenderness and no fullness. Left adnexum displays no mass, no tenderness and no fullness.  Musculoskeletal: Normal range of motion. She exhibits no edema.  Lymphadenopathy:    She has no cervical adenopathy.    She has no axillary adenopathy.       Right axillary: No lateral adenopathy present.       Left axillary: No lateral adenopathy present.      Right: No supraclavicular adenopathy present.       Left: No supraclavicular adenopathy present.  Neurological: She is alert and oriented to person, place, and time.  CN grossly intact, station and gait intact  Skin: Skin is warm and dry. No rash noted.  Psychiatric: She has a normal mood and affect. Her behavior is normal. Judgment and thought content normal.      Assessment & Plan:

## 2013-05-10 ENCOUNTER — Encounter: Payer: Self-pay | Admitting: *Deleted

## 2013-05-10 ENCOUNTER — Other Ambulatory Visit: Payer: 59

## 2013-05-10 DIAGNOSIS — Z1211 Encounter for screening for malignant neoplasm of colon: Secondary | ICD-10-CM

## 2013-05-10 LAB — FECAL OCCULT BLOOD, IMMUNOCHEMICAL: Fecal Occult Bld: NEGATIVE

## 2013-05-31 ENCOUNTER — Encounter: Payer: Self-pay | Admitting: *Deleted

## 2013-05-31 ENCOUNTER — Ambulatory Visit
Admission: RE | Admit: 2013-05-31 | Discharge: 2013-05-31 | Disposition: A | Discharge status: Home / Self Care | Payer: 59 | Source: Ambulatory Visit

## 2013-05-31 DIAGNOSIS — Z1231 Encounter for screening mammogram for malignant neoplasm of breast: Secondary | ICD-10-CM

## 2013-08-10 ENCOUNTER — Other Ambulatory Visit: Payer: Self-pay | Admitting: Family Medicine

## 2013-10-14 ENCOUNTER — Other Ambulatory Visit: Payer: Self-pay | Admitting: Family Medicine

## 2013-12-09 ENCOUNTER — Other Ambulatory Visit: Payer: Self-pay | Admitting: Family Medicine

## 2014-05-20 ENCOUNTER — Other Ambulatory Visit: Payer: Self-pay | Admitting: Family Medicine

## 2014-05-20 DIAGNOSIS — I10 Essential (primary) hypertension: Secondary | ICD-10-CM

## 2014-05-20 DIAGNOSIS — E7800 Pure hypercholesterolemia, unspecified: Secondary | ICD-10-CM

## 2014-05-22 ENCOUNTER — Other Ambulatory Visit (INDEPENDENT_AMBULATORY_CARE_PROVIDER_SITE_OTHER): Payer: 59

## 2014-05-22 DIAGNOSIS — I10 Essential (primary) hypertension: Secondary | ICD-10-CM

## 2014-05-22 DIAGNOSIS — E7800 Pure hypercholesterolemia, unspecified: Secondary | ICD-10-CM

## 2014-05-22 LAB — LIPID PANEL
Cholesterol: 177 mg/dL (ref 0–200)
HDL: 47.5 mg/dL (ref >39.00)
LDL Cholesterol: 115 mg/dL — ABNORMAL HIGH (ref 0–99)
NonHDL: 129.5
Total CHOL/HDL Ratio: 4
Triglycerides: 73 mg/dL (ref 0.0–149.0)
VLDL: 14.6 mg/dL (ref 0.0–40.0)

## 2014-05-22 LAB — BASIC METABOLIC PANEL
BUN: 12 mg/dL (ref 6–23)
CO2: 27 mEq/L (ref 19–32)
CREATININE: 0.9 mg/dL (ref 0.4–1.2)
Calcium: 8.9 mg/dL (ref 8.4–10.5)
Chloride: 110 mEq/L (ref 96–112)
GFR: 85.23 mL/min (ref >60.00)
Glucose, Bld: 94 mg/dL (ref 70–99)
POTASSIUM: 3.7 meq/L (ref 3.5–5.1)
Sodium: 140 mEq/L (ref 135–145)

## 2014-05-29 ENCOUNTER — Ambulatory Visit (INDEPENDENT_AMBULATORY_CARE_PROVIDER_SITE_OTHER): Payer: 59 | Admitting: Family Medicine

## 2014-05-29 ENCOUNTER — Encounter: Payer: Self-pay | Admitting: Family Medicine

## 2014-05-29 ENCOUNTER — Other Ambulatory Visit (HOSPITAL_COMMUNITY)
Admission: RE | Admit: 2014-05-29 | Discharge: 2014-05-29 | Disposition: A | Discharge status: Home / Self Care | Payer: 59 | Source: Ambulatory Visit | Attending: Family Medicine | Admitting: Family Medicine

## 2014-05-29 VITALS — BP 130/80 | HR 60 | Temp 98.2°F | Ht 62.0 in | Wt 161.5 lb

## 2014-05-29 DIAGNOSIS — E7800 Pure hypercholesterolemia, unspecified: Secondary | ICD-10-CM

## 2014-05-29 DIAGNOSIS — I10 Essential (primary) hypertension: Secondary | ICD-10-CM

## 2014-05-29 DIAGNOSIS — Z1211 Encounter for screening for malignant neoplasm of colon: Secondary | ICD-10-CM

## 2014-05-29 DIAGNOSIS — Z01419 Encounter for gynecological examination (general) (routine) without abnormal findings: Secondary | ICD-10-CM | POA: Insufficient documentation

## 2014-05-29 DIAGNOSIS — Z0000 Encounter for general adult medical examination without abnormal findings: Secondary | ICD-10-CM

## 2014-05-29 DIAGNOSIS — Z1151 Encounter for screening for human papillomavirus (HPV): Secondary | ICD-10-CM | POA: Insufficient documentation

## 2014-05-29 DIAGNOSIS — N951 Menopausal and female climacteric states: Secondary | ICD-10-CM

## 2014-05-29 LAB — HM PAP SMEAR: HM Pap smear: NORMAL

## 2014-05-29 MED ORDER — VALSARTAN-HYDROCHLOROTHIAZIDE 160-12.5 MG PO TABS: ORAL_TABLET | ORAL | Status: DC

## 2014-05-29 MED ORDER — NORETHIN-ETH ESTRAD TRIPHASIC 0.5/0.75/1-35 MG-MCG PO TABS: ORAL_TABLET | ORAL | Status: DC

## 2014-05-29 MED ORDER — ATENOLOL 100 MG PO TABS: ORAL_TABLET | ORAL | Status: DC

## 2014-05-29 NOTE — Assessment & Plan Note (Signed)
Late to menopause. Continues OCP. Discussed option of d/c birth control and monitoring periods. Discussed reasons to stop birth control. If stopped discussed need to use protection 100% time. Pt will consider this. Refilled OCP for now.

## 2014-05-29 NOTE — Patient Instructions (Signed)
Stool kit today. Return in 1 year for next wellness exam. Breast center # to call for mammogram appointment:   [phone] Could stop birth control pill to see how periods change - watch for worsening hot flashes and let me know if this happens. If we stop birth control, you would need 2nd form of protection 100% time (like condoms).

## 2014-05-29 NOTE — Assessment & Plan Note (Signed)
Chronic, stable off meds.  

## 2014-05-29 NOTE — Assessment & Plan Note (Signed)
Chronic, stable. Continue 3 med regimen.

## 2014-05-29 NOTE — Assessment & Plan Note (Signed)
Preventative protocols reviewed and updated unless pt declined. Discussed healthy diet and lifestyle.  

## 2014-05-29 NOTE — Progress Notes (Signed)
BP 130/80  Pulse 60  Temp(Src) 98.2 F (36.8 C) (Oral)  Ht 5\' 2"  (1.575 m)  Wt 161 lb 8 oz (73.256 kg)  BMI 29.53 kg/m2  LMP 05/24/2014   CC: CPE  Subjective:    Patient ID: Julie Baker, female    DOB: 23-Dec-1955, 58 y.o.   MRN: [id number]  HPI: Julie Baker is a 58 y.o. female presenting on 05/29/2014 for Annual Exam   Regular periods, LMP 05/24/2014. Currently finishing period. On nortrel 7/7/7 for last 17 yrs. No bleeding between periods. +cramping with periods. Occasional hot flashes at night time.  Mother suffering through ovarian cancer, hospice involved. Weight loss from family stress. Wt Readings from Last 3 Encounters:  05/29/14 161 lb 8 oz (73.256 kg)  05/03/13 168 lb 4 oz (76.318 kg)  11/02/12 171 lb 4 oz (77.678 kg)  Body mass index is 29.53 kg/(m^2).  Preventative: Colon screening - normal stool kits in past. Requests continued.  Mammogram - last 05/12/2013 normal. Will call to reschedule. Pap smear - always normal, last 2013. Due for rpt today. +fmhx ovarian (mother) and uterine cancer (aunt).  Tetanus 2009.  Flu shot - does not get.  Seat belt use 100% time.  Caffeine: 1 cup coffee/day  Married, lives with husband.  Occupation: [employer]  Activity: some yardwork, wants to restart walking  Diet: moderate, eats some junk, chips, fast food, eats out a lot. Daily fruits and veggies. Good water. Some dairy, no milk.   Relevant past medical, surgical, family and social history reviewed and updated as indicated.  Allergies and medications reviewed and updated. Current Outpatient Prescriptions on File Prior to Visit  Medication Sig  . SUMAtriptan (IMITREX) 100 MG tablet Take one by mouth as directed as needed    No current facility-administered medications on file prior to visit.    Review of Systems  Constitutional: Negative for fever, chills, activity change, appetite change, fatigue and unexpected weight change.  HENT: Negative for hearing loss.    Eyes: Negative for visual disturbance.  Respiratory: Negative for cough, chest tightness, shortness of breath and wheezing.   Cardiovascular: Negative for chest pain, palpitations and leg swelling.  Gastrointestinal: Negative for nausea, vomiting, abdominal pain, diarrhea, constipation, blood in stool and abdominal distention.  Genitourinary: Negative for hematuria and difficulty urinating.  Musculoskeletal: Negative for arthralgias, myalgias and neck pain.  Skin: Negative for rash.  Neurological: Positive for headaches. Negative for dizziness, seizures and syncope.  Hematological: Negative for adenopathy. Does not bruise/bleed easily.  Psychiatric/Behavioral: Negative for dysphoric mood. The patient is not nervous/anxious.    Per HPI unless specifically indicated above    Objective:    BP 130/80  Pulse 60  Temp(Src) 98.2 F (36.8 C) (Oral)  Ht 5\' 2"  (1.575 m)  Wt 161 lb 8 oz (73.256 kg)  BMI 29.53 kg/m2  LMP 05/24/2014  Physical Exam  Nursing note and vitals reviewed. Constitutional: She is oriented to person, place, and time. She appears well-developed and well-nourished. No distress.  HENT:  Head: Normocephalic and atraumatic.  Right Ear: Hearing, tympanic membrane, external ear and ear canal normal.  Left Ear: Hearing, tympanic membrane, external ear and ear canal normal.  Nose: Nose normal.  Mouth/Throat: Uvula is midline, oropharynx is clear and moist and mucous membranes are normal. No oropharyngeal exudate, posterior oropharyngeal edema or posterior oropharyngeal erythema.  Eyes: Conjunctivae and EOM are normal. Pupils are equal, round, and reactive to light. No scleral icterus.  Neck: Normal range of  motion. Neck supple. Carotid bruit is not present. No thyromegaly present.  Cardiovascular: Normal rate, regular rhythm, normal heart sounds and intact distal pulses.   No murmur heard. Pulses:      Radial pulses are 2+ on the right side, and 2+ on the left side.    Pulmonary/Chest: Effort normal and breath sounds normal. No respiratory distress. She has no wheezes. She has no rales. Right breast exhibits no inverted nipple, no mass, no nipple discharge, no skin change and no tenderness. Left breast exhibits no inverted nipple, no mass, no nipple discharge, no skin change and no tenderness.  Abdominal: Soft. Bowel sounds are normal. She exhibits no distension and no mass. There is no tenderness. There is no rebound and no guarding.  Genitourinary: Vagina normal and uterus normal. Pelvic exam was performed with patient supine. There is no rash, tenderness, lesion or injury on the right labia. There is no rash, tenderness, lesion or injury on the left labia. Cervix exhibits no motion tenderness, no discharge and no friability. Right adnexum displays no mass, no tenderness and no fullness. Left adnexum displays no mass, no tenderness and no fullness.  Musculoskeletal: Normal range of motion. She exhibits no edema.  Lymphadenopathy:       Head (right side): No submental, no submandibular, no tonsillar, no preauricular and no posterior auricular adenopathy present.       Head (left side): No submental, no submandibular, no tonsillar, no preauricular and no posterior auricular adenopathy present.    She has no cervical adenopathy.    She has no axillary adenopathy.       Right axillary: No lateral adenopathy present.       Left axillary: No lateral adenopathy present.      Right: No supraclavicular adenopathy present.       Left: No supraclavicular adenopathy present.  Neurological: She is alert and oriented to person, place, and time.  CN grossly intact, station and gait intact  Skin: Skin is warm and dry. No rash noted.  Psychiatric: She has a normal mood and affect. Her behavior is normal. Judgment and thought content normal.   Results for orders placed in visit on 05/22/14  BASIC METABOLIC PANEL      Result Value Ref Range   Sodium 140  135 - 145 mEq/L    Potassium 3.7  3.5 - 5.1 mEq/L   Chloride 110  96 - 112 mEq/L   CO2 27  19 - 32 mEq/L   Glucose, Bld 94  70 - 99 mg/dL   BUN 12  6 - 23 mg/dL   Creatinine, Ser 0.9  0.4 - 1.2 mg/dL   Calcium 8.9  8.4 - 16.110.5 mg/dL   GFR 09.6085.23  >45.40>60.00 mL/min  LIPID PANEL      Result Value Ref Range   Cholesterol 177  0 - 200 mg/dL   Triglycerides 98.173.0  0.0 - 149.0 mg/dL   HDL 19.1447.50  >78.29>39.00 mg/dL   VLDL 56.214.6  0.0 - 13.040.0 mg/dL   LDL Cholesterol 865115 (*) 0 - 99 mg/dL   Total CHOL/HDL Ratio 4     NonHDL 129.50        Assessment & Plan:   Problem List Items Addressed This Visit   Perimenopausal     Late to menopause. Continues OCP. Discussed option of d/c birth control and monitoring periods. Discussed reasons to stop birth control. If stopped discussed need to use protection 100% time. Pt will consider this. Refilled OCP for now.  HYPERTENSION     Chronic, stable. Continue 3 med regimen.    Relevant Medications      atenolol (TENORMIN) tablet      valsartan-hydrochlorothiazide (DIOVAN HCT) 160-12.5 MG per tablet   HYPERCHOLESTEROLEMIA, PURE     Chronic, stable off meds.    Relevant Medications      atenolol (TENORMIN) tablet      valsartan-hydrochlorothiazide (DIOVAN HCT) 160-12.5 MG per tablet   Healthcare maintenance - Primary     Preventative protocols reviewed and updated unless pt declined. Discussed healthy diet and lifestyle.    Relevant Orders      Cytology - PAP    Other Visit Diagnoses   Special screening for malignant neoplasms, colon        Relevant Orders       Fecal occult blood, imunochemical        Follow up plan: Return in about 1 year (around 05/30/2015), or as needed, for annual exam, prior fasting for blood work.

## 2014-05-29 NOTE — Progress Notes (Signed)
Pre visit review using our clinic review tool, if applicable. No additional management support is needed unless otherwise documented below in the visit note. 

## 2014-05-30 ENCOUNTER — Telehealth: Payer: Self-pay | Admitting: Family Medicine

## 2014-05-30 LAB — CYTOLOGY - PAP

## 2014-05-30 NOTE — Telephone Encounter (Signed)
Relevant patient education assigned to patient using Emmi. ° °

## 2014-05-31 ENCOUNTER — Other Ambulatory Visit: Payer: Self-pay

## 2014-05-31 DIAGNOSIS — Z1231 Encounter for screening mammogram for malignant neoplasm of breast: Secondary | ICD-10-CM

## 2014-06-01 ENCOUNTER — Other Ambulatory Visit: Payer: 59

## 2014-06-01 DIAGNOSIS — Z1211 Encounter for screening for malignant neoplasm of colon: Secondary | ICD-10-CM

## 2014-06-01 LAB — FECAL OCCULT BLOOD, GUAIAC: Fecal Occult Blood: NEGATIVE

## 2014-06-01 LAB — FECAL OCCULT BLOOD, IMMUNOCHEMICAL: FECAL OCCULT BLD: NEGATIVE

## 2014-06-05 ENCOUNTER — Encounter: Payer: Self-pay | Admitting: *Deleted

## 2014-06-16 ENCOUNTER — Ambulatory Visit
Admission: RE | Admit: 2014-06-16 | Discharge: 2014-06-16 | Disposition: A | Discharge status: Home / Self Care | Payer: 59 | Source: Ambulatory Visit

## 2014-06-16 DIAGNOSIS — Z1231 Encounter for screening mammogram for malignant neoplasm of breast: Secondary | ICD-10-CM

## 2014-06-19 ENCOUNTER — Other Ambulatory Visit: Payer: Self-pay | Admitting: Family Medicine

## 2014-06-19 LAB — HM MAMMOGRAPHY: HM Mammogram: NORMAL

## 2014-06-20 ENCOUNTER — Encounter: Payer: Self-pay | Admitting: *Deleted

## 2015-05-28 ENCOUNTER — Other Ambulatory Visit: Payer: Self-pay | Admitting: Family Medicine

## 2015-05-29 ENCOUNTER — Other Ambulatory Visit: Payer: Self-pay | Admitting: Family Medicine

## 2015-05-29 DIAGNOSIS — I10 Essential (primary) hypertension: Secondary | ICD-10-CM

## 2015-05-29 DIAGNOSIS — E7800 Pure hypercholesterolemia, unspecified: Secondary | ICD-10-CM

## 2015-05-30 ENCOUNTER — Other Ambulatory Visit (INDEPENDENT_AMBULATORY_CARE_PROVIDER_SITE_OTHER): Payer: Commercial Managed Care - HMO

## 2015-05-30 DIAGNOSIS — I10 Essential (primary) hypertension: Secondary | ICD-10-CM

## 2015-05-30 DIAGNOSIS — E7800 Pure hypercholesterolemia, unspecified: Secondary | ICD-10-CM

## 2015-05-30 LAB — VITAMIN D 25 HYDROXY (VIT D DEFICIENCY, FRACTURES): VITD: 16.48 ng/mL — ABNORMAL LOW (ref 30.00–100.00)

## 2015-05-30 LAB — BASIC METABOLIC PANEL
BUN: 12 mg/dL (ref 6–23)
CO2: 26 meq/L (ref 19–32)
CREATININE: 0.95 mg/dL (ref 0.40–1.20)
Calcium: 9 mg/dL (ref 8.4–10.5)
Chloride: 107 mEq/L (ref 96–112)
GFR: 77.74 mL/min (ref >60.00)
Glucose, Bld: 94 mg/dL (ref 70–99)
POTASSIUM: 3.5 meq/L (ref 3.5–5.1)
Sodium: 141 mEq/L (ref 135–145)

## 2015-05-30 LAB — LIPID PANEL
Cholesterol: 170 mg/dL (ref 0–200)
HDL: 53 mg/dL (ref >39.00)
LDL CALC: 104 mg/dL — AB (ref 0–99)
NonHDL: 117
TRIGLYCERIDES: 63 mg/dL (ref 0.0–149.0)
Total CHOL/HDL Ratio: 3
VLDL: 12.6 mg/dL (ref 0.0–40.0)

## 2015-06-06 ENCOUNTER — Ambulatory Visit (INDEPENDENT_AMBULATORY_CARE_PROVIDER_SITE_OTHER): Payer: Commercial Managed Care - HMO | Admitting: Family Medicine

## 2015-06-06 ENCOUNTER — Encounter: Payer: Self-pay | Admitting: Family Medicine

## 2015-06-06 ENCOUNTER — Other Ambulatory Visit: Payer: Self-pay

## 2015-06-06 VITALS — BP 110/64 | HR 64 | Temp 98.1°F | Ht 62.0 in | Wt 168.0 lb

## 2015-06-06 DIAGNOSIS — G43009 Migraine without aura, not intractable, without status migrainosus: Secondary | ICD-10-CM

## 2015-06-06 DIAGNOSIS — N951 Menopausal and female climacteric states: Secondary | ICD-10-CM

## 2015-06-06 DIAGNOSIS — E7800 Pure hypercholesterolemia, unspecified: Secondary | ICD-10-CM

## 2015-06-06 DIAGNOSIS — Z0000 Encounter for general adult medical examination without abnormal findings: Secondary | ICD-10-CM

## 2015-06-06 DIAGNOSIS — Z1231 Encounter for screening mammogram for malignant neoplasm of breast: Secondary | ICD-10-CM

## 2015-06-06 DIAGNOSIS — Z1211 Encounter for screening for malignant neoplasm of colon: Secondary | ICD-10-CM

## 2015-06-06 DIAGNOSIS — I10 Essential (primary) hypertension: Secondary | ICD-10-CM

## 2015-06-06 MED ORDER — VALSARTAN-HYDROCHLOROTHIAZIDE 160-12.5 MG PO TABS: ORAL_TABLET | ORAL | Status: DC

## 2015-06-06 MED ORDER — ATENOLOL 100 MG PO TABS: ORAL_TABLET | ORAL | Status: DC

## 2015-06-06 NOTE — Assessment & Plan Note (Signed)
Chronic, stable. Continue current regimen. 

## 2015-06-06 NOTE — Progress Notes (Signed)
BP 110/64 mmHg  Pulse 64  Temp(Src) 98.1 F (36.7 C) (Oral)  Ht  (1.575 m)  Wt 168 lb (76.204 kg)  BMI 30.72 kg/m2  LMP 05/26/2015   CC: CPE  Subjective:    Patient ID: Julie Baker, female    DOB: [birth year], 59 y.o.   MRN: [id number]  HPI: Julie Baker is a 59 y.o. female presenting on 06/06/2015 for Annual Exam   Migraines - topamax and imitrex.  HTN - compliant with and tolerating blood pressure medicines.  Regular periods, LMP 05/26/2015. On nortrel 7/7/7 for last 18 yrs. No bleeding between periods. +cramping with periods. Occasional hot flashes at night time.   Mother with ovarian cancer. Aunt with uterine cancer.   Preventative: Colon screening - normal stool kits in past. Requests continued. Mammogram - last 05/2014 normal. Will call to reschedule. Well woman - Qyearly pelvic exam, Q22yr pap always normal, last 2015. +fmhx ovarian (mother) and uterine cancer (aunt).  Flu shot - does not get.  Tetanus 2009. Seat belt use 100% time. No changing moles on skin  Caffeine: 1 cup coffee/day  Married, lives with husband.  Occupation: [employer]  Activity: some yardwork, walking Diet: moderate, eats some junk, chips, fast food, eats out a lot. Daily fruits and veggies. Good water. Some dairy, no milk.   Relevant past medical, surgical, family and social history reviewed and updated as indicated. Interim medical history since our last visit reviewed. Allergies and medications reviewed and updated. Current Outpatient Prescriptions on File Prior to Visit  Medication Sig  . NORTREL 7/7/7 0.5/0.75/1-35 MG-MCG tablet TAKE 1 TABLET EVERY DAY  . SUMAtriptan (IMITREX) 100 MG tablet Take one by mouth as directed as needed   . topiramate (TOPAMAX) 100 MG tablet Take 100 mg by mouth at bedtime.   No current facility-administered medications on file prior to visit.    Review of Systems  Constitutional: Negative for fever, chills, activity change, appetite  change, fatigue and unexpected weight change.  HENT: Negative for hearing loss.   Eyes: Negative for visual disturbance.  Respiratory: Negative for cough, chest tightness, shortness of breath and wheezing.   Cardiovascular: Negative for chest pain, palpitations and leg swelling.  Gastrointestinal: Negative for nausea, vomiting, abdominal pain, diarrhea, constipation, blood in stool and abdominal distention.  Genitourinary: Negative for hematuria and difficulty urinating.  Musculoskeletal: Negative for myalgias, arthralgias and neck pain.  Skin: Negative for rash.  Neurological: Negative for dizziness, seizures, syncope and headaches.  Hematological: Negative for adenopathy. Does not bruise/bleed easily.  Psychiatric/Behavioral: Negative for dysphoric mood. The patient is not nervous/anxious.    Per HPI unless specifically indicated above     Objective:    BP 110/64 mmHg  Pulse 64  Temp(Src) 98.1 F (36.7 C) (Oral)  Ht  (1.575 m)  Wt 168 lb (76.204 kg)  BMI 30.72 kg/m2  LMP 05/26/2015  Wt Readings from Last 3 Encounters:  06/06/15 168 lb (76.204 kg)  05/29/14 161 lb 8 oz (73.256 kg)  05/03/13 168 lb 4 oz (76.318 kg)    Physical Exam  Constitutional: She is oriented to person, place, and time. She appears well-developed and well-nourished. No distress.  HENT:  Head: Normocephalic and atraumatic.  Right Ear: Hearing, tympanic membrane, external ear and ear canal normal.  Left Ear: Hearing, tympanic membrane, external ear and ear canal normal.  Nose: Nose normal.  Mouth/Throat: Uvula is midline, oropharynx is clear and moist and mucous membranes are normal. No oropharyngeal exudate,  posterior oropharyngeal edema or posterior oropharyngeal erythema.  Eyes: Conjunctivae and EOM are normal. Pupils are equal, round, and reactive to light. No scleral icterus.  Neck: Normal range of motion. Neck supple. No thyromegaly present.  Cardiovascular: Normal rate, regular rhythm, normal  heart sounds and intact distal pulses.   No murmur heard. Pulses:      Radial pulses are 2+ on the right side, and 2+ on the left side.  Pulmonary/Chest: Effort normal and breath sounds normal. No respiratory distress. She has no wheezes. She has no rales.  Breast - deferred  Abdominal: Soft. Bowel sounds are normal. She exhibits no distension and no mass. There is no tenderness. There is no rebound and no guarding.  Genitourinary: Vagina normal and uterus normal. Pelvic exam was performed with patient supine. There is no rash, tenderness or lesion on the right labia. There is no rash, tenderness or lesion on the left labia. Right adnexum displays no mass, no tenderness and no fullness. Left adnexum displays no mass, no tenderness and no fullness.  Pap not done.  Musculoskeletal: Normal range of motion. She exhibits no edema.  Lymphadenopathy:    She has no cervical adenopathy.  Neurological: She is alert and oriented to person, place, and time.  CN grossly intact, station and gait intact  Skin: Skin is warm and dry. No rash noted.  Psychiatric: She has a normal mood and affect. Her behavior is normal. Judgment and thought content normal.  Nursing note and vitals reviewed.  Results for orders placed or performed in visit on 05/30/15  Lipid panel  Result Value Ref Range   Cholesterol 170 0 - 200 mg/dL   Triglycerides 82.963.0 0.0 - 149.0 mg/dL   HDL 56.2153.00 >30.86>39.00 mg/dL   VLDL 57.812.6 0.0 - 46.940.0 mg/dL   LDL Cholesterol 629104 (H) 0 - 99 mg/dL   Total CHOL/HDL Ratio 3    NonHDL 117.00   Basic metabolic panel  Result Value Ref Range   Sodium 141 135 - 145 mEq/L   Potassium 3.5 3.5 - 5.1 mEq/L   Chloride 107 96 - 112 mEq/L   CO2 26 19 - 32 mEq/L   Glucose, Bld 94 70 - 99 mg/dL   BUN 12 6 - 23 mg/dL   Creatinine, Ser 5.280.95 0.40 - 1.20 mg/dL   Calcium 9.0 8.4 - 41.310.5 mg/dL   GFR 24.4077.74 >10.27>60.00 mL/min  Vit D  25 hydroxy (rtn osteoporosis monitoring)  Result Value Ref Range   VITD 16.48 (L) 30.00 -  100.00 ng/mL      Assessment & Plan:   Problem List Items Addressed This Visit    Essential hypertension    Chronic, stable. Continue current regimen.      Relevant Medications   atenolol (TENORMIN) 100 MG tablet   valsartan-hydrochlorothiazide (DIOVAN HCT) 160-12.5 MG per tablet   Healthcare maintenance - Primary    Preventative protocols reviewed and updated unless pt declined. Discussed healthy diet and lifestyle.       HYPERCHOLESTEROLEMIA, PURE    Very mild off meds. Continue to monitor.      Relevant Medications   atenolol (TENORMIN) 100 MG tablet   valsartan-hydrochlorothiazide (DIOVAN HCT) 160-12.5 MG per tablet   Migraine without aura    Followed by headache center.      Relevant Medications   atenolol (TENORMIN) 100 MG tablet   valsartan-hydrochlorothiazide (DIOVAN HCT) 160-12.5 MG per tablet   Perimenopausal    Again discussed OCP use perimenopausally. rec stop OCP and monitor periods  off hormonal therapy. Anticipate will enter menopausal transition off OCP, discussed condom use initially.       Other Visit Diagnoses    Special screening for malignant neoplasms, colon        Relevant Orders    Fecal occult blood, imunochemical        Follow up plan: Return in about 1 year (around 06/05/2016), or as needed, for annual exam, prior fasting for blood work.

## 2015-06-06 NOTE — Assessment & Plan Note (Signed)
Very mild off meds. Continue to monitor.

## 2015-06-06 NOTE — Assessment & Plan Note (Signed)
Followed by headache center

## 2015-06-06 NOTE — Progress Notes (Signed)
Pre visit review using our clinic review tool, if applicable. No additional management support is needed unless otherwise documented below in the visit note. 

## 2015-06-06 NOTE — Patient Instructions (Addendum)
Pass by lab to pick up stool kit. Call to schedule mammogram. Start vitamin D 1000-2000 units over the counter daily. Let's try off birth control and see how periods do. Return as needed or in 1 year for next physical. Good to see you today, call us with questions.

## 2015-06-06 NOTE — Assessment & Plan Note (Signed)
Again discussed OCP use perimenopausally. rec stop OCP and monitor periods off hormonal therapy. Anticipate will enter menopausal transition off OCP, discussed condom use initially.

## 2015-06-06 NOTE — Assessment & Plan Note (Signed)
Preventative protocols reviewed and updated unless pt declined. Discussed healthy diet and lifestyle.  

## 2015-06-11 ENCOUNTER — Other Ambulatory Visit (INDEPENDENT_AMBULATORY_CARE_PROVIDER_SITE_OTHER): Payer: Commercial Managed Care - HMO

## 2015-06-11 DIAGNOSIS — Z1211 Encounter for screening for malignant neoplasm of colon: Secondary | ICD-10-CM | POA: Diagnosis not present

## 2015-06-11 LAB — FECAL OCCULT BLOOD, IMMUNOCHEMICAL: FECAL OCCULT BLD: NEGATIVE

## 2015-06-11 LAB — FECAL OCCULT BLOOD, GUAIAC: Fecal Occult Blood: NEGATIVE

## 2015-06-18 ENCOUNTER — Ambulatory Visit
Admission: RE | Admit: 2015-06-18 | Discharge: 2015-06-18 | Disposition: A | Discharge status: Home / Self Care | Payer: Commercial Managed Care - HMO | Source: Ambulatory Visit

## 2015-06-18 DIAGNOSIS — Z1231 Encounter for screening mammogram for malignant neoplasm of breast: Secondary | ICD-10-CM

## 2015-06-19 ENCOUNTER — Encounter: Payer: Self-pay | Admitting: *Deleted

## 2015-06-19 ENCOUNTER — Encounter: Payer: Self-pay | Admitting: Family Medicine

## 2015-06-23 ENCOUNTER — Other Ambulatory Visit: Payer: Self-pay | Admitting: Family Medicine

## 2016-06-09 ENCOUNTER — Other Ambulatory Visit: Payer: Commercial Managed Care - HMO

## 2016-06-10 ENCOUNTER — Ambulatory Visit (INDEPENDENT_AMBULATORY_CARE_PROVIDER_SITE_OTHER): Payer: Commercial Managed Care - HMO | Admitting: Family Medicine

## 2016-06-10 ENCOUNTER — Encounter: Payer: Self-pay | Admitting: Family Medicine

## 2016-06-10 VITALS — BP 124/72 | HR 68 | Temp 98.3°F | Wt 175.5 lb

## 2016-06-10 DIAGNOSIS — J020 Streptococcal pharyngitis: Secondary | ICD-10-CM | POA: Diagnosis not present

## 2016-06-10 LAB — POCT RAPID STREP A (OFFICE): RAPID STREP A SCREEN: POSITIVE — AB

## 2016-06-10 MED ORDER — PENICILLIN V POTASSIUM 500 MG PO TABS: 500.0000 mg | ORAL_TABLET | Freq: Three times a day (TID) | ORAL | Status: DC

## 2016-06-10 NOTE — Patient Instructions (Signed)

## 2016-06-10 NOTE — Addendum Note (Signed)
Addended by: Desmond DikeKNIGHT, Vickee Mormino H on: 06/10/2016 12:25 PM   Modules accepted: Orders, SmartSet

## 2016-06-10 NOTE — Progress Notes (Signed)
SUBJECTIVE: 60 y.o. female pt of Dr. Reece AgarG, new to me, with sore throat, myalgias, swollen glands, headache without feiver for 2 days. No history of rheumatic fever. Other symptoms: dry cough.  Current Outpatient Prescriptions on File Prior to Visit  Medication Sig Dispense Refill  . atenolol (TENORMIN) 100 MG tablet TAKE 1 TABLET BY MOUTH EVERY DAY 90 tablet 3  . cholecalciferol (VITAMIN D) 1000 UNITS tablet Take 1,000 Units by mouth daily.    . SUMAtriptan (IMITREX) 100 MG tablet Take one by mouth as directed as needed     . topiramate (TOPAMAX) 100 MG tablet Take 100 mg by mouth at bedtime.    . valsartan-hydrochlorothiazide (DIOVAN HCT) 160-12.5 MG per tablet TAKE 1 TABLET BY MOUTH EVERY DAY 90 tablet 3   No current facility-administered medications on file prior to visit.    No Known Allergies  Past Medical History  Diagnosis Date  . Hypertension   . Migraines   . HLD (hyperlipidemia)     mild    No past surgical history on file.  Family History  Problem Relation Age of Onset  . Cancer Mother [id number]    ovarian, recurrence  . Hypertension Brother   . Cancer Maternal Aunt     uterine cancer  . Cancer Paternal Grandfather     HEENT  . Coronary artery disease Neg Hx   . Stroke Maternal Grandmother   . Diabetes Maternal Uncle     s/p leg amputation  . Diabetes Paternal Aunt     Social History   Social History  . Marital Status: Married    Spouse Name: N/A  . Number of Children: N/A  . Years of Education: N/A   Occupational History  .  [employer]   Social History Main Topics  . Smoking status: Never Smoker   . Smokeless tobacco: Never Used  . Alcohol Use: No  . Drug Use: No  . Sexual Activity: Not on file   Other Topics Concern  . Not on file   Social History Narrative   Caffeine: 1 cup coffee/day   Married, lives with husband.   Occupation: [employer]   Activity: backed off on walking (was 2-3 times a week)   Diet: moderate, eats some junk,  chips, fast food, eats out a lot.  Fruits and veggies occ. Some water. No calcium.   The PMH, PSH, Social History, Family History, Medications, and allergies [facility], and have been updated if relevant.  OBJECTIVE:  BP 124/72 mmHg  Pulse 68  Temp(Src) 98.3 F (36.8 C) (Oral)  Wt 175 lb 8 oz (79.606 kg)  SpO2 95%  LMP 05/22/2015  Vitals as noted above. Appears alert, well appearing, and in no distress. Ears: bilateral TM's and external ear canals normal Oropharynx: thick, white exudate and tonsillar hypertrophy Neck: supple, no significant adenopathy Lungs: clear to auscultation, no wheezes, rales or rhonchi, symmetric air entry Rapid Strep test is positive  ASSESSMENT: Streptococcal pharyngitis  PLAN: Per orders.  PCN 500 mg three times daily x 10 days.   Gargle, use acetaminophen or other OTC analgesic, and take Rx fully as prescribed. Call if other family members develop similar symptoms. See prn.

## 2016-06-10 NOTE — Progress Notes (Signed)
Pre visit review using our clinic review tool, if applicable. No additional management support is needed unless otherwise documented below in the visit note. 

## 2016-06-17 ENCOUNTER — Other Ambulatory Visit: Payer: Self-pay | Admitting: Family Medicine

## 2016-06-17 DIAGNOSIS — Z1159 Encounter for screening for other viral diseases: Secondary | ICD-10-CM

## 2016-06-17 DIAGNOSIS — I10 Essential (primary) hypertension: Secondary | ICD-10-CM

## 2016-06-17 DIAGNOSIS — E7800 Pure hypercholesterolemia, unspecified: Secondary | ICD-10-CM

## 2016-06-17 DIAGNOSIS — E559 Vitamin D deficiency, unspecified: Secondary | ICD-10-CM | POA: Insufficient documentation

## 2016-06-18 ENCOUNTER — Encounter: Payer: Commercial Managed Care - HMO | Admitting: Family Medicine

## 2016-06-18 ENCOUNTER — Other Ambulatory Visit (INDEPENDENT_AMBULATORY_CARE_PROVIDER_SITE_OTHER): Payer: Commercial Managed Care - HMO

## 2016-06-18 DIAGNOSIS — E7800 Pure hypercholesterolemia, unspecified: Secondary | ICD-10-CM

## 2016-06-18 DIAGNOSIS — Z1159 Encounter for screening for other viral diseases: Secondary | ICD-10-CM

## 2016-06-18 DIAGNOSIS — E559 Vitamin D deficiency, unspecified: Secondary | ICD-10-CM | POA: Diagnosis not present

## 2016-06-18 DIAGNOSIS — I10 Essential (primary) hypertension: Secondary | ICD-10-CM | POA: Diagnosis not present

## 2016-06-18 LAB — BASIC METABOLIC PANEL
BUN: 9 mg/dL (ref 6–23)
CALCIUM: 9.6 mg/dL (ref 8.4–10.5)
CHLORIDE: 106 meq/L (ref 96–112)
CO2: 29 meq/L (ref 19–32)
CREATININE: 0.89 mg/dL (ref 0.40–1.20)
GFR: 83.52 mL/min (ref >60.00)
GLUCOSE: 91 mg/dL (ref 70–99)
Potassium: 3.5 mEq/L (ref 3.5–5.1)
SODIUM: 142 meq/L (ref 135–145)

## 2016-06-18 LAB — LIPID PANEL
CHOLESTEROL: 185 mg/dL (ref 0–200)
HDL: 53.4 mg/dL (ref >39.00)
LDL Cholesterol: 108 mg/dL — ABNORMAL HIGH (ref 0–99)
NONHDL: 131.99
Total CHOL/HDL Ratio: 3
Triglycerides: 122 mg/dL (ref 0.0–149.0)
VLDL: 24.4 mg/dL (ref 0.0–40.0)

## 2016-06-18 LAB — VITAMIN D 25 HYDROXY (VIT D DEFICIENCY, FRACTURES): VITD: 21.16 ng/mL — ABNORMAL LOW (ref 30.00–100.00)

## 2016-06-19 LAB — HEPATITIS C ANTIBODY: HCV Ab: NEGATIVE

## 2016-06-25 ENCOUNTER — Other Ambulatory Visit (HOSPITAL_COMMUNITY)
Admission: RE | Admit: 2016-06-25 | Discharge: 2016-06-25 | Disposition: A | Discharge status: Home / Self Care | Payer: Commercial Managed Care - HMO | Source: Ambulatory Visit | Attending: Family Medicine | Admitting: Family Medicine

## 2016-06-25 ENCOUNTER — Other Ambulatory Visit: Payer: Self-pay | Admitting: Family Medicine

## 2016-06-25 ENCOUNTER — Encounter: Payer: Self-pay | Admitting: Family Medicine

## 2016-06-25 ENCOUNTER — Ambulatory Visit (INDEPENDENT_AMBULATORY_CARE_PROVIDER_SITE_OTHER): Payer: Commercial Managed Care - HMO | Admitting: Family Medicine

## 2016-06-25 VITALS — BP 110/70 | HR 69 | Temp 98.5°F | Ht 62.0 in | Wt 174.0 lb

## 2016-06-25 DIAGNOSIS — I10 Essential (primary) hypertension: Secondary | ICD-10-CM

## 2016-06-25 DIAGNOSIS — R059 Cough, unspecified: Secondary | ICD-10-CM

## 2016-06-25 DIAGNOSIS — Z01419 Encounter for gynecological examination (general) (routine) without abnormal findings: Secondary | ICD-10-CM | POA: Diagnosis present

## 2016-06-25 DIAGNOSIS — Z780 Asymptomatic menopausal state: Secondary | ICD-10-CM | POA: Diagnosis not present

## 2016-06-25 DIAGNOSIS — G43009 Migraine without aura, not intractable, without status migrainosus: Secondary | ICD-10-CM

## 2016-06-25 DIAGNOSIS — Z124 Encounter for screening for malignant neoplasm of cervix: Secondary | ICD-10-CM | POA: Diagnosis not present

## 2016-06-25 DIAGNOSIS — R05 Cough: Secondary | ICD-10-CM

## 2016-06-25 DIAGNOSIS — E559 Vitamin D deficiency, unspecified: Secondary | ICD-10-CM | POA: Diagnosis not present

## 2016-06-25 DIAGNOSIS — E7800 Pure hypercholesterolemia, unspecified: Secondary | ICD-10-CM

## 2016-06-25 DIAGNOSIS — Z0000 Encounter for general adult medical examination without abnormal findings: Secondary | ICD-10-CM

## 2016-06-25 DIAGNOSIS — R011 Cardiac murmur, unspecified: Secondary | ICD-10-CM

## 2016-06-25 DIAGNOSIS — Z1211 Encounter for screening for malignant neoplasm of colon: Secondary | ICD-10-CM

## 2016-06-25 DIAGNOSIS — K219 Gastro-esophageal reflux disease without esophagitis: Secondary | ICD-10-CM | POA: Insufficient documentation

## 2016-06-25 DIAGNOSIS — E669 Obesity, unspecified: Secondary | ICD-10-CM

## 2016-06-25 LAB — HM PAP SMEAR: HM PAP: NORMAL

## 2016-06-25 NOTE — Assessment & Plan Note (Signed)
Stable on current regimen. Followed by [facility].

## 2016-06-25 NOTE — Assessment & Plan Note (Signed)
Chronic, stable. Continue current regimen. 

## 2016-06-25 NOTE — Assessment & Plan Note (Addendum)
Great control off meds.  Will resolve.

## 2016-06-25 NOTE — Progress Notes (Signed)
BP 110/70   Pulse 69   Temp 98.5 F (36.9 C) (Oral)   Ht  (1.575 m)   Wt 174 lb (78.9 kg)   LMP 05/22/2015   SpO2 98%   BMI 31.83 kg/m    CC: CPE Subjective:    Patient ID: Julie Baker, female    DOB: 30-Nov-1956, 60 y.o.   MRN: [id number]  HPI: Julie Baker is a 60 y.o. female presenting on 06/25/2016 for Annual Exam   Lingering night time cough off and on, productive of sputum occasionally. No fevers/chills. Started 2 wks ago since she had strep throat.  Migraines - topamax and imitrex once monthly.  HTN - compliant with and tolerating atenolol  once daily, Diovan HCT 160/12.5mg  daily.   Mother with ovarian cancer. Aunt with uterine cancer.   Preventative: Colon screening - always normal stool kits. Requests rpt iFOB.  Mammogram - 06/2015 normal. Does not do breast exams at home.  Well woman - Qyearly pelvic exam, Q44yr pap due today, last 2015.  +fmhx ovarian (mother) and uterine cancer (aunt).  LMP 05/2015.  Flu shot - does not get.  Tetanus 2009. Seat belt use discussed No changing moles on skin  Caffeine: 1 cup coffee/day  Married, lives with husband.  Occupation: [employer]  Activity: some yardwork, walking 3x/wk Diet: moderate, eats some junk, chips, fast food, eats out a lot. Daily fruits and veggies. Good water. Some dairy, no milk. Sodas and tea.   Relevant past medical, surgical, family and social history reviewed and updated as indicated. Interim medical history since our last visit reviewed. Allergies and medications reviewed and updated. Current Outpatient Prescriptions on File Prior to Visit  Medication Sig  . cholecalciferol (VITAMIN D) 1000 UNITS tablet Take 1,000 Units by mouth daily.  . SUMAtriptan (IMITREX) 100 MG tablet Take one by mouth as directed as needed   . topiramate (TOPAMAX) 100 MG tablet Take 100 mg by mouth at bedtime.  . valsartan-hydrochlorothiazide (DIOVAN HCT) 160-12.5 MG per tablet TAKE 1 TABLET BY MOUTH  EVERY DAY   No current facility-administered medications on file prior to visit.     Review of Systems  Constitutional: Negative for activity change, appetite change, chills, fatigue, fever and unexpected weight change.  HENT: Negative for hearing loss.   Eyes: Negative for visual disturbance.  Respiratory: Positive for cough (at night time). Negative for chest tightness, shortness of breath and wheezing.   Cardiovascular: Negative for chest pain, palpitations and leg swelling.  Gastrointestinal: Negative for abdominal distention, abdominal pain, blood in stool, constipation, diarrhea, nausea and vomiting.  Genitourinary: Negative for difficulty urinating and hematuria.  Musculoskeletal: Negative for arthralgias, myalgias and neck pain.  Skin: Negative for rash.  Neurological: Negative for dizziness, seizures, syncope and headaches.  Hematological: Negative for adenopathy. Does not bruise/bleed easily.  Psychiatric/Behavioral: Negative for dysphoric mood. The patient is not nervous/anxious.    Per HPI unless specifically indicated in ROS section     Objective:    BP 110/70   Pulse 69   Temp 98.5 F (36.9 C) (Oral)   Ht  (1.575 m)   Wt 174 lb (78.9 kg)   LMP 05/22/2015   SpO2 98%   BMI 31.83 kg/m   Wt Readings from Last 3 Encounters:  06/25/16 174 lb (78.9 kg)  06/10/16 175 lb 8 oz (79.6 kg)  06/06/15 168 lb (76.2 kg)    Physical Exam  Constitutional: She is oriented to person, place, and time. She  appears well-developed and well-nourished. No distress.  HENT:  Head: Normocephalic and atraumatic.  Right Ear: Hearing, tympanic membrane, external ear and ear canal normal.  Left Ear: Hearing, tympanic membrane, external ear and ear canal normal.  Nose: Nose normal.  Mouth/Throat: Uvula is midline, oropharynx is clear and moist and mucous membranes are normal. No oropharyngeal exudate, posterior oropharyngeal edema or posterior oropharyngeal erythema.  Eyes:  Conjunctivae and EOM are normal. Pupils are equal, round, and reactive to light. No scleral icterus.  Neck: Normal range of motion. Neck supple.  Cardiovascular: Normal rate, regular rhythm, normal heart sounds and intact distal pulses.   No murmur heard. Pulses:      Radial pulses are 2+ on the right side, and 2+ on the left side.  Pulmonary/Chest: Effort normal and breath sounds normal. No respiratory distress. She has no wheezes. She has no rales. Right breast exhibits no inverted nipple, no mass, no nipple discharge and no skin change. Left breast exhibits no inverted nipple, no mass, no nipple discharge and no skin change.  Abdominal: Soft. Bowel sounds are normal. She exhibits no distension and no mass. There is no tenderness. There is no rebound and no guarding.  Genitourinary: Vagina normal and uterus normal. Pelvic exam was performed with patient supine. There is no rash, tenderness or lesion on the right labia. There is no rash, tenderness or lesion on the left labia. Cervix exhibits no motion tenderness, no discharge and no friability. Right adnexum displays no mass, no tenderness and no fullness. Left adnexum displays no mass, no tenderness and no fullness.  Musculoskeletal: Normal range of motion. She exhibits no edema.  Lymphadenopathy:    She has no cervical adenopathy.  Neurological: She is alert and oriented to person, place, and time.  CN grossly intact, station and gait intact  Skin: Skin is warm and dry. No rash noted.  Psychiatric: She has a normal mood and affect. Her behavior is normal. Judgment and thought content normal.  Nursing note and vitals reviewed.  Results for orders placed or performed in visit on 06/18/16  Lipid panel  Result Value Ref Range   Cholesterol 185 0 - 200 mg/dL   Triglycerides 209.4 0.0 - 149.0 mg/dL   HDL 70.96 >28.36 mg/dL   VLDL 62.9 0.0 - 47.6 mg/dL   LDL Cholesterol 546 (H) 0 - 99 mg/dL   Total CHOL/HDL Ratio 3    NonHDL 131.99   Basic  metabolic panel  Result Value Ref Range   Sodium 142 135 - 145 mEq/L   Potassium 3.5 3.5 - 5.1 mEq/L   Chloride 106 96 - 112 mEq/L   CO2 29 19 - 32 mEq/L   Glucose, Bld 91 70 - 99 mg/dL   BUN 9 6 - 23 mg/dL   Creatinine, Ser 5.03 0.40 - 1.20 mg/dL   Calcium 9.6 8.4 - 54.6 mg/dL   GFR 56.81 >27.51 mL/min  VITAMIN D 25 Hydroxy (Vit-D Deficiency, Fractures)  Result Value Ref Range   VITD 21.16 (L) 30.00 - 100.00 ng/mL  Hepatitis C antibody  Result Value Ref Range   HCV Ab NEGATIVE NEGATIVE      Assessment & Plan:   Problem List Items Addressed This Visit    Cough    Anticipate post infectious cough of a few weeks duration, rec plain mucinex, update if not resolving as expected.       Essential hypertension    Chronic, stable. Continue current regimen.       Healthcare maintenance -  Primary    Preventative protocols reviewed and updated unless pt declined. Discussed healthy diet and lifestyle.       RESOLVED: HYPERCHOLESTEROLEMIA, PURE    Great control off meds.  Will resolve.       Migraine without aura    Stable on current regimen. Followed by [facility].      Obesity (BMI 30.0-34.9)    Discussed healthy diet and lifestyle changes to affect sustainable weight loss.      Post-menopausal    OCPs stopped last year. Last period 05/2015. No spotting or bleeding since then.       SYSTOLIC MURMUR    Very faint      Vitamin D deficiency    Has not been regular with vit D. rec restart 1000 IU daily.       Other Visit Diagnoses    Pap smear for cervical cancer screening       Relevant Orders   Cytology - PAP   Special screening for malignant neoplasms, colon       Relevant Orders   Fecal occult blood, imunochemical       Follow up plan: Return in about 1 year (around 06/25/2017), or as needed, for annual exam, prior fasting for blood work.  Eustaquio Boyden, MD

## 2016-06-25 NOTE — Assessment & Plan Note (Signed)
Discussed healthy diet and lifestyle changes to affect sustainable weight loss  

## 2016-06-25 NOTE — Assessment & Plan Note (Signed)
Has not been regular with vit D. rec restart 1000 IU daily.

## 2016-06-25 NOTE — Assessment & Plan Note (Signed)
OCPs stopped last year. Last period 05/2015. No spotting or bleeding since then.

## 2016-06-25 NOTE — Progress Notes (Signed)
Pre visit review using our clinic review tool, if applicable. No additional management support is needed unless otherwise documented below in the visit note. 

## 2016-06-25 NOTE — Assessment & Plan Note (Signed)
Anticipate post infectious cough of a few weeks duration, rec plain mucinex, update if not resolving as expected.

## 2016-06-25 NOTE — Patient Instructions (Addendum)
Call to schedule mammogram at your convenience at the breast center.  Pass by lab to pick up stool kit.  Work on diet and increased walking.  Try plain mucinex or guaifenesin to break up mucous.  You are doing well today, return as needed or in 1 year for next physical.  Health Maintenance, Female Adopting a healthy lifestyle and getting preventive care can go a long way to promote health and wellness. Talk with your health care provider about what schedule of regular examinations is right for you. This is a good chance for you to check in with your provider about disease prevention and staying healthy. In between checkups, there are plenty of things you can do on your own. Experts have done a lot of research about which lifestyle changes and preventive measures are most likely to keep you healthy. Ask your health care provider for more information. WEIGHT AND DIET  Eat a healthy diet  Be sure to include plenty of vegetables, fruits, low-fat dairy products, and lean protein.  Do not eat a lot of foods high in solid fats, added sugars, or salt.  Get regular exercise. This is one of the most important things you can do for your health.  Most adults should exercise for at least 150 minutes each week. The exercise should increase your heart rate and make you sweat (moderate-intensity exercise).  Most adults should also do strengthening exercises at least twice a week. This is in addition to the moderate-intensity exercise.  Maintain a healthy weight  Body mass index (BMI) is a measurement that can be used to identify possible weight problems. It estimates body fat based on height and weight. Your health care provider can help determine your BMI and help you achieve or maintain a healthy weight.  For females 8 years of age and older:   A BMI below 18.5 is considered underweight.  A BMI of 18.5 to 24.9 is normal.  A BMI of 25 to 29.9 is considered overweight.  A BMI of 30 and above is  considered obese.  Watch levels of cholesterol and blood lipids  You should start having your blood tested for lipids and cholesterol at 59 years of age, then have this test every 5 years.  You may need to have your cholesterol levels checked more often if:  Your lipid or cholesterol levels are high.  You are older than 59 years of age.  You are at high risk for heart disease.  CANCER SCREENING   Lung Cancer  Lung cancer screening is recommended for adults 66-80 years old who are at high risk for lung cancer because of a history of smoking.  A yearly low-dose CT scan of the lungs is recommended for people who:  Currently smoke.  Have quit within the past 15 years.  Have at least a 30-pack-year history of smoking. A pack year is smoking an average of one pack of cigarettes a day for 1 year.  Yearly screening should continue until it has been 15 years since you quit.  Yearly screening should stop if you develop a health problem that would prevent you from having lung cancer treatment.  Breast Cancer  Practice breast self-awareness. This means understanding how your breasts normally appear and feel.  It also means doing regular breast self-exams. Let your health care provider know about any changes, no matter how small.  If you are in your 20s or 30s, you should have a clinical breast exam (CBE) by a health care  provider every 1-3 years as part of a regular health exam.  If you are 40 or older, have a CBE every year. Also consider having a breast X-ray (mammogram) every year.  If you have a family history of breast cancer, talk to your health care provider about genetic screening.  If you are at high risk for breast cancer, talk to your health care provider about having an MRI and a mammogram every year.  Breast cancer gene (BRCA) assessment is recommended for women who have family members with BRCA-related cancers. BRCA-related cancers  include:  Breast.  Ovarian.  Tubal.  Peritoneal cancers.  Results of the assessment will determine the need for genetic counseling and BRCA1 and BRCA2 testing. Cervical Cancer Your health care provider may recommend that you be screened regularly for cancer of the pelvic organs (ovaries, uterus, and vagina). This screening involves a pelvic examination, including checking for microscopic changes to the surface of your cervix (Pap test). You may be encouraged to have this screening done every 3 years, beginning at age 21.  For women ages 30-65, health care providers may recommend pelvic exams and Pap testing every 3 years, or they may recommend the Pap and pelvic exam, combined with testing for human papilloma virus (HPV), every 5 years. Some types of HPV increase your risk of cervical cancer. Testing for HPV may also be done on women of any age with unclear Pap test results.  Other health care providers may not recommend any screening for nonpregnant women who are considered low risk for pelvic cancer and who do not have symptoms. Ask your health care provider if a screening pelvic exam is right for you.  If you have had past treatment for cervical cancer or a condition that could lead to cancer, you need Pap tests and screening for cancer for at least 20 years after your treatment. If Pap tests have been discontinued, your risk factors (such as having a new sexual partner) need to be reassessed to determine if screening should resume. Some women have medical problems that increase the chance of getting cervical cancer. In these cases, your health care provider may recommend more frequent screening and Pap tests. Colorectal Cancer  This type of cancer can be detected and often prevented.  Routine colorectal cancer screening usually begins at 59 years of age and continues through 59 years of age.  Your health care provider may recommend screening at an earlier age if you have risk factors for  colon cancer.  Your health care provider may also recommend using home test kits to check for hidden blood in the stool.  A small camera at the end of a tube can be used to examine your colon directly (sigmoidoscopy or colonoscopy). This is done to check for the earliest forms of colorectal cancer.  Routine screening usually begins at age 50.  Direct examination of the colon should be repeated every 5-10 years through 59 years of age. However, you may need to be screened more often if early forms of precancerous polyps or small growths are found. Skin Cancer  Check your skin from head to toe regularly.  Tell your health care provider about any new moles or changes in moles, especially if there is a change in a mole's shape or color.  Also tell your health care provider if you have a mole that is larger than the size of a pencil eraser.  Always use sunscreen. Apply sunscreen liberally and repeatedly throughout the day.  Protect yourself   by wearing long sleeves, pants, a wide-brimmed hat, and sunglasses whenever you are outside. HEART DISEASE, DIABETES, AND HIGH BLOOD PRESSURE   High blood pressure causes heart disease and increases the risk of stroke. High blood pressure is more likely to develop in:  People who have blood pressure in the high end of the normal range (130-139/85-89 mm Hg).  People who are overweight or obese.  People who are African American.  If you are 69-2 years of age, have your blood pressure checked every 3-5 years. If you are 13 years of age or older, have your blood pressure checked every year. You should have your blood pressure measured twice--once when you are at a hospital or clinic, and once when you are not at a hospital or clinic. Record the average of the two measurements. To check your blood pressure when you are not at a hospital or clinic, you can use:  An automated blood pressure machine at a pharmacy.  A home blood pressure monitor.  If you  are between 66 years and 34 years old, ask your health care provider if you should take aspirin to prevent strokes.  Have regular diabetes screenings. This involves taking a blood sample to check your fasting blood sugar level.  If you are at a normal weight and have a low risk for diabetes, have this test once every three years after 59 years of age.  If you are overweight and have a high risk for diabetes, consider being tested at a younger age or more often. PREVENTING INFECTION  Hepatitis B  If you have a higher risk for hepatitis B, you should be screened for this virus. You are considered at high risk for hepatitis B if:  You were born in a country where hepatitis B is common. Ask your health care provider which countries are considered high risk.  Your parents were born in a high-risk country, and you have not been immunized against hepatitis B (hepatitis B vaccine).  You have HIV or AIDS.  You use needles to inject street drugs.  You live with someone who has hepatitis B.  You have had sex with someone who has hepatitis B.  You get hemodialysis treatment.  You take certain medicines for conditions, including cancer, organ transplantation, and autoimmune conditions. Hepatitis C  Blood testing is recommended for:  Everyone born from [birth year] through 1964.  Anyone with known risk factors for hepatitis C. Sexually transmitted infections (STIs)  You should be screened for sexually transmitted infections (STIs) including gonorrhea and chlamydia if:  You are sexually active and are younger than 60 years of age.  You are older than 59 years of age and your health care provider tells you that you are at risk for this type of infection.  Your sexual activity has changed since you were last screened and you are at an increased risk for chlamydia or gonorrhea. Ask your health care provider if you are at risk.  If you do not have HIV, but are at risk, it may be recommended that you  take a prescription medicine daily to prevent HIV infection. This is called pre-exposure prophylaxis (PrEP). You are considered at risk if:  You are sexually active and do not regularly use condoms or know the HIV status of your partner(s).  You take drugs by injection.  You are sexually active with a partner who has HIV. Talk with your health care provider about whether you are at high risk of being infected with HIV.  If you choose to begin PrEP, you should first be tested for HIV. You should then be tested every 3 months for as long as you are taking PrEP.  PREGNANCY   If you are premenopausal and you may become pregnant, ask your health care provider about preconception counseling.  If you may become pregnant, take 400 to 800 micrograms (mcg) of folic acid every day.  If you want to prevent pregnancy, talk to your health care provider about birth control (contraception). OSTEOPOROSIS AND MENOPAUSE   Osteoporosis is a disease in which the bones lose minerals and strength with aging. This can result in serious bone fractures. Your risk for osteoporosis can be identified using a bone density scan.  If you are 28 years of age or older, or if you are at risk for osteoporosis and fractures, ask your health care provider if you should be screened.  Ask your health care provider whether you should take a calcium or vitamin D supplement to lower your risk for osteoporosis.  Menopause may have certain physical symptoms and risks.  Hormone replacement therapy may reduce some of these symptoms and risks. Talk to your health care provider about whether hormone replacement therapy is right for you.  HOME CARE INSTRUCTIONS   Schedule regular health, dental, and eye exams.  Stay current with your immunizations.   Do not use any tobacco products including cigarettes, chewing tobacco, or electronic cigarettes.  If you are pregnant, do not drink alcohol.  If you are breastfeeding, limit how  much and how often you drink alcohol.  Limit alcohol intake to no more than 1 drink per day for nonpregnant women. One drink equals 12 ounces of beer, 5 ounces of wine, or 1 ounces of hard liquor.  Do not use street drugs.  Do not share needles.  Ask your health care provider for help if you need support or information about quitting drugs.  Tell your health care provider if you often feel depressed.  Tell your health care provider if you have ever been abused or do not feel safe at home.   This information is not intended to replace advice given to you by your health care provider. Make sure you discuss any questions you have with your health care provider.   Document Released: 06/02/2011 Document Revised: 12/08/2014 Document Reviewed: 10/19/2013 Elsevier Interactive Patient Education [employer].

## 2016-06-25 NOTE — Assessment & Plan Note (Signed)
Preventative protocols reviewed and updated unless pt declined. Discussed healthy diet and lifestyle.  

## 2016-06-25 NOTE — Assessment & Plan Note (Signed)
Very faint

## 2016-06-26 ENCOUNTER — Other Ambulatory Visit: Payer: Self-pay | Admitting: Family Medicine

## 2016-06-26 DIAGNOSIS — Z1231 Encounter for screening mammogram for malignant neoplasm of breast: Secondary | ICD-10-CM

## 2016-06-26 LAB — CYTOLOGY - PAP

## 2016-06-27 ENCOUNTER — Encounter: Payer: Self-pay | Admitting: *Deleted

## 2016-07-03 ENCOUNTER — Other Ambulatory Visit (INDEPENDENT_AMBULATORY_CARE_PROVIDER_SITE_OTHER): Payer: Commercial Managed Care - HMO

## 2016-07-03 DIAGNOSIS — Z1211 Encounter for screening for malignant neoplasm of colon: Secondary | ICD-10-CM

## 2016-07-03 LAB — FECAL OCCULT BLOOD, GUAIAC: Fecal Occult Blood: NEGATIVE

## 2016-07-03 LAB — FECAL OCCULT BLOOD, IMMUNOCHEMICAL: Fecal Occult Bld: NEGATIVE

## 2016-07-07 ENCOUNTER — Encounter: Payer: Self-pay | Admitting: *Deleted

## 2016-07-09 ENCOUNTER — Ambulatory Visit
Admission: RE | Admit: 2016-07-09 | Discharge: 2016-07-09 | Disposition: A | Discharge status: Home / Self Care | Payer: Commercial Managed Care - HMO | Source: Ambulatory Visit | Attending: Family Medicine | Admitting: Family Medicine

## 2016-07-09 DIAGNOSIS — Z1231 Encounter for screening mammogram for malignant neoplasm of breast: Secondary | ICD-10-CM

## 2016-07-09 LAB — HM MAMMOGRAPHY

## 2016-07-10 ENCOUNTER — Encounter: Payer: Self-pay | Admitting: *Deleted

## 2016-07-11 ENCOUNTER — Other Ambulatory Visit: Payer: Self-pay | Admitting: Family Medicine

## 2016-12-04 DIAGNOSIS — G43111 Migraine with aura, intractable, with status migrainosus: Secondary | ICD-10-CM | POA: Diagnosis not present

## 2016-12-04 DIAGNOSIS — G43019 Migraine without aura, intractable, without status migrainosus: Secondary | ICD-10-CM | POA: Diagnosis not present

## 2016-12-04 DIAGNOSIS — G43719 Chronic migraine without aura, intractable, without status migrainosus: Secondary | ICD-10-CM | POA: Diagnosis not present

## 2017-05-22 ENCOUNTER — Ambulatory Visit (INDEPENDENT_AMBULATORY_CARE_PROVIDER_SITE_OTHER): Payer: 59 | Admitting: Internal Medicine

## 2017-05-22 ENCOUNTER — Encounter: Payer: Self-pay | Admitting: Internal Medicine

## 2017-05-22 VITALS — BP 114/80 | HR 63 | Temp 98.7°F | Wt 168.4 lb

## 2017-05-22 DIAGNOSIS — R05 Cough: Secondary | ICD-10-CM | POA: Diagnosis not present

## 2017-05-22 DIAGNOSIS — R509 Fever, unspecified: Secondary | ICD-10-CM | POA: Diagnosis not present

## 2017-05-22 DIAGNOSIS — Z7722 Contact with and (suspected) exposure to environmental tobacco smoke (acute) (chronic): Secondary | ICD-10-CM

## 2017-05-22 DIAGNOSIS — R053 Chronic cough: Secondary | ICD-10-CM

## 2017-05-22 MED ORDER — DOXYCYCLINE HYCLATE 100 MG PO TABS: 100.0000 mg | ORAL_TABLET | Freq: Two times a day (BID) | ORAL | 0 refills | Status: DC

## 2017-05-22 NOTE — Progress Notes (Signed)
Chief Complaint  Patient presents with  . Cough    HPI: Julie Baker 60 y.o.  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 She got a cough about 3-4 weeks ago without associated fever that improved about 90% for about a week and then developed worsening cough with sore throat somewhere in there that is ongoing. No wheezing or shortness of breath but last night she thought she had a fever. Cough was initially dry and now a little bit loose but no hemoptysis. Her friends and coworkers told to begin it checked out because she could have "bronchitis". Her uncle recently passed away transferred from a nursing home with pneumonia. No other known exposures. Continues to work but felt bad this morning.   ROS: See pertinent positives and negatives per HPI. denies ur sx  Of improt and  No hx of allergic sx .   Past Medical History:  Diagnosis Date  . HLD (hyperlipidemia)    mild  . Hypertension   . Migraines     Family History  Problem Relation Age of Onset  . Cancer Mother 30       ovarian, recurrence  . Hypertension Brother   . Cancer Maternal Aunt        uterine cancer  . Cancer Paternal Grandfather        HEENT  . Coronary artery disease Neg Hx   . Stroke Maternal Grandmother   . Diabetes Maternal Uncle        s/p leg amputation  . Diabetes Paternal Aunt     Social History   Social History  . Marital status: Married    Spouse name: N/A  . Number of children: N/A  . Years of education: N/A   Occupational History  .  [employer]   Social History Main Topics  . Smoking status: Passive Smoke Exposure - Never Smoker  . Smokeless tobacco: Never Used  . Alcohol use No  . Drug use: No  . Sexual activity: Not Asked   Other Topics Concern  . None   Social History Narrative   Caffeine: 1 cup coffee/day   Married, lives with husband.   Occupation: [employer]     Activity: backed off on walking (was 2-3 times a week)   Diet: moderate, eats some junk, chips, fast food, eats out a lot.  Fruits and veggies occ. Some water. No calcium.    Outpatient Medications Prior to Visit  Medication Sig Dispense Refill  . atenolol (TENORMIN) 100 MG tablet TAKE 1 TABLET BY MOUTH EVERY DAY 90 tablet 3  . cholecalciferol (VITAMIN D) 1000 UNITS tablet Take 1,000 Units by mouth daily.    Marland Kitchen DIOVAN HCT 160-12.5 MG tablet TAKE 1 TABLET BY MOUTH EVERY DAY 90 tablet 3  . SUMAtriptan (IMITREX) 100 MG tablet Take one by mouth as directed as needed     . topiramate (TOPAMAX) 100 MG tablet Take 100 mg by mouth at bedtime.     No facility-administered medications prior to visit.      EXAM:  BP 114/80 (BP Location: Left Arm, Patient Position: Sitting, Cuff Size: Normal)   Pulse 63   Temp 98.7 F (37.1 C) (Oral)   Wt 168 lb 6.4 oz (76.4 kg)   LMP 05/26/2015   BMI 30.80 kg/m   Body mass index is 30.8 kg/m.  GENERAL: vitals reviewed  and listed above, alert, oriented, appears well hydrated and in no acute distress non toxic ocass deep min loose cough  Normal resp HEENT: atraumatic, conjunctiva  clear, no obvious abnormalities on inspection of external nose and ears tm clear OP : no lesion edema or exudate sinus neg tenderness  NECK: no obvious masses on inspection palpation  LUNGS: clear to auscultation bilaterally, no wheezes, rales or rhonchi,  CV: HRRR, no clubbing cyanosis or  peripheral edema nl cap refill  MS: moves all extremities without noticeable focal  abnormality PSYCH: pleasant and cooperative, no obvious depression or anxiety  ASSESSMENT AND PLAN:  Discussed the following assessment and plan:  Cough, persistent - Plan: DG Chest 2 View  Low grade fever - Plan: DG Chest 2 View  Passive smoke exposure Cough may be initially a viral etiology and self-limiting however because of the relapsing history potential and the feeling that she had fever last  night and more concerned about her developing bacterial infection. At this time continue symptomatic treatment prescription however printed out to begin if develops a fever tonight. [facility] follow-up if continuing problem with fever and see her PCP. This is Friday for the weekends which changes expectant management. -Patient advised to return or notify health care team  if symptoms worsen ,persist or new concerns arise.  Patient Instructions  Her chest exam is reassuring today. However because of the question of relapsing illness and fever, if you get a fever tonight with worsening please begin the antibiotic and follow-up with her primary physician. Most coughs are caused by problems get better on their and such as bronchitis allergy viral infections. However because your cough is relapsing and prolonged we are taking a different strategy.  Continue fluids hot tea cough medicines don't work quite well but she can take them such as Mucinex.    Neta MendsWanda K. Aadvik Baker M.D.

## 2017-05-22 NOTE — Patient Instructions (Signed)
Her chest exam is reassuring today. However because of the question of relapsing illness and fever, if you get a fever tonight with worsening please begin the antibiotic and follow-up with her primary physician. Most coughs are caused by problems get better on their and such as bronchitis allergy viral infections. However because your cough is relapsing and prolonged we are taking a different strategy.  Continue fluids hot tea cough medicines don't work quite well but she can take them such as Mucinex.

## 2017-06-08 DIAGNOSIS — G43019 Migraine without aura, intractable, without status migrainosus: Secondary | ICD-10-CM | POA: Diagnosis not present

## 2017-06-08 DIAGNOSIS — G43111 Migraine with aura, intractable, with status migrainosus: Secondary | ICD-10-CM | POA: Diagnosis not present

## 2017-06-08 DIAGNOSIS — G43719 Chronic migraine without aura, intractable, without status migrainosus: Secondary | ICD-10-CM | POA: Diagnosis not present

## 2017-06-24 ENCOUNTER — Other Ambulatory Visit: Payer: Self-pay | Admitting: Family Medicine

## 2017-06-24 DIAGNOSIS — I10 Essential (primary) hypertension: Secondary | ICD-10-CM

## 2017-06-24 DIAGNOSIS — E559 Vitamin D deficiency, unspecified: Secondary | ICD-10-CM

## 2017-06-25 ENCOUNTER — Other Ambulatory Visit (INDEPENDENT_AMBULATORY_CARE_PROVIDER_SITE_OTHER): Payer: 59

## 2017-06-25 DIAGNOSIS — I10 Essential (primary) hypertension: Secondary | ICD-10-CM | POA: Diagnosis not present

## 2017-06-25 DIAGNOSIS — E559 Vitamin D deficiency, unspecified: Secondary | ICD-10-CM

## 2017-06-25 LAB — BASIC METABOLIC PANEL
BUN: 14 mg/dL (ref 6–23)
CALCIUM: 9.4 mg/dL (ref 8.4–10.5)
CO2: 26 mEq/L (ref 19–32)
Chloride: 108 mEq/L (ref 96–112)
Creatinine, Ser: 0.93 mg/dL (ref 0.40–1.20)
GFR: 79.11 mL/min (ref >60.00)
Glucose, Bld: 98 mg/dL (ref 70–99)
POTASSIUM: 3.7 meq/L (ref 3.5–5.1)
Sodium: 141 mEq/L (ref 135–145)

## 2017-06-25 LAB — LIPID PANEL
CHOL/HDL RATIO: 3
Cholesterol: 181 mg/dL (ref 0–200)
HDL: 57 mg/dL (ref >39.00)
LDL Cholesterol: 108 mg/dL — ABNORMAL HIGH (ref 0–99)
NONHDL: 123.76
Triglycerides: 77 mg/dL (ref 0.0–149.0)
VLDL: 15.4 mg/dL (ref 0.0–40.0)

## 2017-06-25 LAB — VITAMIN D 25 HYDROXY (VIT D DEFICIENCY, FRACTURES): VITD: 19.71 ng/mL — AB (ref 30.00–100.00)

## 2017-06-29 ENCOUNTER — Other Ambulatory Visit: Payer: Self-pay | Admitting: Family Medicine

## 2017-06-29 ENCOUNTER — Encounter: Payer: Self-pay | Admitting: Family Medicine

## 2017-06-29 ENCOUNTER — Ambulatory Visit (INDEPENDENT_AMBULATORY_CARE_PROVIDER_SITE_OTHER): Payer: 59 | Admitting: Family Medicine

## 2017-06-29 VITALS — BP 132/68 | HR 92 | Temp 98.1°F | Ht 62.0 in | Wt 169.8 lb

## 2017-06-29 DIAGNOSIS — Z1211 Encounter for screening for malignant neoplasm of colon: Secondary | ICD-10-CM

## 2017-06-29 DIAGNOSIS — R05 Cough: Secondary | ICD-10-CM

## 2017-06-29 DIAGNOSIS — Z0000 Encounter for general adult medical examination without abnormal findings: Secondary | ICD-10-CM | POA: Diagnosis not present

## 2017-06-29 DIAGNOSIS — I10 Essential (primary) hypertension: Secondary | ICD-10-CM | POA: Diagnosis not present

## 2017-06-29 DIAGNOSIS — E559 Vitamin D deficiency, unspecified: Secondary | ICD-10-CM | POA: Diagnosis not present

## 2017-06-29 DIAGNOSIS — Z1231 Encounter for screening mammogram for malignant neoplasm of breast: Secondary | ICD-10-CM

## 2017-06-29 DIAGNOSIS — E66811 Obesity, class 1: Secondary | ICD-10-CM

## 2017-06-29 DIAGNOSIS — R059 Cough, unspecified: Secondary | ICD-10-CM

## 2017-06-29 DIAGNOSIS — E669 Obesity, unspecified: Secondary | ICD-10-CM

## 2017-06-29 MED ORDER — VALSARTAN-HYDROCHLOROTHIAZIDE 160-12.5 MG PO TABS: 1.0000 | ORAL_TABLET | Freq: Every day | ORAL | 3 refills | Status: DC

## 2017-06-29 MED ORDER — ATENOLOL 100 MG PO TABS: 100.0000 mg | ORAL_TABLET | Freq: Every day | ORAL | 3 refills | Status: DC

## 2017-06-29 MED ORDER — VITAMIN D3 1.25 MG (50000 UT) PO TABS: 1.0000 | ORAL_TABLET | ORAL | 1 refills | Status: DC

## 2017-06-29 NOTE — Progress Notes (Signed)
BP 132/68   Pulse 92   Temp 98.1 F (36.7 C) (Oral)   Ht 5\' 2"  (1.575 m)   Wt 169 lb 12 oz (77 kg)   LMP 05/26/2015   SpO2 98%   BMI 31.05 kg/m    CC: CPE Subjective:    Patient ID: Julie Baker, female    DOB: 1956-07-03, 61 y.o.   MRN: [id number]  HPI: Julie Baker is a 61 y.o. female presenting on 06/29/2017 for Annual Exam   Saw Dr Fabian Sharp 05/2017, note reviewed. Intermittent cough persists. Worse at night time. Cough mildly productive. No fevers.   fmhx GYN cancer - mother ovarian, aunt uterine.  She forgets vitamin D 1000 IU daily.   Preventative: Colon screening - always normal stool kits. Requests rpt iFOB.  Mammogram - 07/2016 normal. Does not do breast exams at home.  Well woman - Qyearly pelvic exam, Q24yr pap 2017. +fmhx ovarian (mother) and uterine cancer (aunt).  LMP 05/2015.  Flu shot - does not get.  Tetanus 2009.  Seat belt use discussed Sunscreen use discussed. No changing moles on skin Non smoker  Alcohol - none  Caffeine: 1 cup coffee/day  Married, lives with husband.  Occupation: [employer]  Activity: some yardwork, walking 30 min daily  Diet: unhealthy, eats some junk, chips, fast food, eats out a lot. Daily fruits and veggies. Good water. Some dairy, no milk. Sodas and tea.   Relevant past medical, surgical, family and social history reviewed and updated as indicated. Interim medical history since our last visit reviewed. Allergies and medications reviewed and updated. Outpatient Medications Prior to Visit  Medication Sig Dispense Refill  . cholecalciferol (VITAMIN D) 1000 UNITS tablet Take 1,000 Units by mouth daily.    . SUMAtriptan (IMITREX) 100 MG tablet Take one by mouth as directed as needed     . topiramate (TOPAMAX) 100 MG tablet Take 100 mg by mouth at bedtime.    Marland Kitchen atenolol (TENORMIN) 100 MG tablet TAKE 1 TABLET BY MOUTH EVERY DAY 90 tablet 3  . DIOVAN HCT 160-12.5 MG tablet TAKE 1 TABLET BY MOUTH EVERY DAY 90 tablet 3  .  doxycycline (VIBRA-TABS) 100 MG tablet Take 1 tablet (100 mg total) by mouth 2 (two) times daily. 14 tablet 0   No facility-administered medications prior to visit.      Per HPI unless specifically indicated in ROS section below Review of Systems  Constitutional: Negative for activity change, appetite change, chills, fatigue, fever and unexpected weight change.  HENT: Negative for hearing loss.   Eyes: Negative for visual disturbance.  Respiratory: Positive for cough (mild). Negative for chest tightness, shortness of breath and wheezing.   Cardiovascular: Negative for chest pain, palpitations and leg swelling.  Gastrointestinal: Negative for abdominal distention, abdominal pain, blood in stool, constipation, diarrhea, nausea and vomiting.  Genitourinary: Negative for difficulty urinating and hematuria.  Musculoskeletal: Negative for arthralgias, myalgias and neck pain.  Skin: Negative for rash.  Neurological: Negative for dizziness, seizures, syncope and headaches.  Hematological: Negative for adenopathy. Does not bruise/bleed easily.  Psychiatric/Behavioral: Negative for dysphoric mood. The patient is not nervous/anxious.        Objective:    BP 132/68   Pulse 92   Temp 98.1 F (36.7 C) (Oral)   Ht 5\' 2"  (1.575 m)   Wt 169 lb 12 oz (77 kg)   LMP 05/26/2015   SpO2 98%   BMI 31.05 kg/m   Wt Readings from Last 3 Encounters:  06/29/17 169 lb 12 oz (77 kg)  05/22/17 168 lb 6.4 oz (76.4 kg)  06/25/16 174 lb (78.9 kg)    Physical Exam  Constitutional: She is oriented to person, place, and time. She appears well-developed and well-nourished. No distress.  HENT:  Head: Normocephalic and atraumatic.  Right Ear: Hearing, tympanic membrane, external ear and ear canal normal.  Left Ear: Hearing, tympanic membrane, external ear and ear canal normal.  Nose: Nose normal.  Mouth/Throat: Uvula is midline, oropharynx is clear and moist and mucous membranes are normal. No oropharyngeal  exudate, posterior oropharyngeal edema or posterior oropharyngeal erythema.  Eyes: Pupils are equal, round, and reactive to light. Conjunctivae and EOM are normal. No scleral icterus.  Neck: Normal range of motion. Neck supple. No thyromegaly present.  Cardiovascular: Normal rate, regular rhythm and intact distal pulses.   Murmur (faint systolic) heard. Pulses:      Radial pulses are 2+ on the right side, and 2+ on the left side.  Pulmonary/Chest: Effort normal and breath sounds normal. No respiratory distress. She has no wheezes. She has no rales. Right breast exhibits no inverted nipple, no mass, no nipple discharge and no skin change. Left breast exhibits no inverted nipple, no mass, no nipple discharge and no skin change.  Abdominal: Soft. Bowel sounds are normal. She exhibits no distension and no mass. There is no tenderness. There is no rebound and no guarding.  Genitourinary: Vagina normal and uterus normal. Pelvic exam was performed with patient supine. There is no rash, tenderness or lesion on the right labia. There is no rash, tenderness or lesion on the left labia. Right adnexum displays no mass, no tenderness and no fullness. Left adnexum displays no mass, no tenderness and no fullness.  Genitourinary Comments: Pap not performed  Musculoskeletal: Normal range of motion. She exhibits no edema.  Lymphadenopathy:       Head (right side): No submental, no submandibular, no tonsillar, no preauricular and no posterior auricular adenopathy present.       Head (left side): No submental, no submandibular, no tonsillar, no preauricular and no posterior auricular adenopathy present.    She has no cervical adenopathy.    She has no axillary adenopathy.       Right axillary: No lateral adenopathy present.       Left axillary: No lateral adenopathy present.      Right: No supraclavicular adenopathy present.       Left: No supraclavicular adenopathy present.  Neurological: She is alert and oriented  to person, place, and time.  CN grossly intact, station and gait intact  Skin: Skin is warm and dry. No rash noted.  Psychiatric: She has a normal mood and affect. Her behavior is normal. Judgment and thought content normal.  Nursing note and vitals reviewed.  Results for orders placed or performed in visit on 06/25/17  Basic metabolic panel  Result Value Ref Range   Sodium 141 135 - 145 mEq/L   Potassium 3.7 3.5 - 5.1 mEq/L   Chloride 108 96 - 112 mEq/L   CO2 26 19 - 32 mEq/L   Glucose, Bld 98 70 - 99 mg/dL   BUN 14 6 - 23 mg/dL   Creatinine, Ser 3.080.93 0.40 - 1.20 mg/dL   Calcium 9.4 8.4 - 65.710.5 mg/dL   GFR 84.6979.11 >62.95>60.00 mL/min  VITAMIN D 25 Hydroxy (Vit-D Deficiency, Fractures)  Result Value Ref Range   VITD 19.71 (L) 30.00 - 100.00 ng/mL  Lipid panel  Result Value Ref  Range   Cholesterol 181 0 - 200 mg/dL   Triglycerides 78.277.0 0.0 - 149.0 mg/dL   HDL 95.6257.00 >13.08>39.00 mg/dL   VLDL 65.715.4 0.0 - 84.640.0 mg/dL   LDL Cholesterol 962108 (H) 0 - 99 mg/dL   Total CHOL/HDL Ratio 3    NonHDL 123.76       Assessment & Plan:   Problem List Items Addressed This Visit    Cough    Lung exam benign today. ?hidden GERD - rec trial ranitidine 150mg  nightly. Update with effect.      Essential hypertension    Chronic, stable. Continue current regimen.      Relevant Medications   valsartan-hydrochlorothiazide (DIOVAN HCT) 160-12.5 MG tablet   atenolol (TENORMIN) 100 MG tablet   Healthcare maintenance - Primary    Preventative protocols reviewed and updated unless pt declined. Discussed healthy diet and lifestyle.       Obesity (BMI 30.0-34.9)    Discussed healthy diet and lifestyle changes to affect sustainable weight loss.       Vitamin D deficiency    Forgets vit D - will Rx 50k u weekly x 6 months.        Other Visit Diagnoses    Special screening for malignant neoplasms, colon       Relevant Orders   Fecal occult blood, imunochemical       Follow up plan: Return in about 1 year  (around 06/29/2018) for annual exam, prior fasting for blood work.  Eustaquio BoydenJavier Alyan Hartline, MD

## 2017-06-29 NOTE — Addendum Note (Signed)
Addended by: Liane ComberHAVERS, Mirko Tailor C on: 06/29/2017 11:04 AM   Modules accepted: Orders

## 2017-06-29 NOTE — Assessment & Plan Note (Signed)
Forgets vit D - will Rx 50k u weekly x 6 months.

## 2017-06-29 NOTE — Assessment & Plan Note (Signed)
Discussed healthy diet and lifestyle changes to affect sustainable weight loss  

## 2017-06-29 NOTE — Assessment & Plan Note (Signed)
Lung exam benign today. ?hidden GERD - rec trial ranitidine 150mg  nightly. Update with effect.

## 2017-06-29 NOTE — Assessment & Plan Note (Addendum)
Preventative protocols reviewed and updated unless pt declined. Discussed healthy diet and lifestyle.  

## 2017-06-29 NOTE — Patient Instructions (Addendum)
Pass by lab to pick up stool kit.  May take tessalon perls for cough as needed Start vitamin D 50,000 units weekly for 6 months then return to 1000 units daily. Call to schedule mammogram and eye doctor appointment. Work on [employer] changes.  Return as needed or in 1 year for next physical.  Health Maintenance, Female Adopting a healthy lifestyle and getting preventive care can go a long way to promote health and wellness. Talk with your health care provider about what schedule of regular examinations is right for you. This is a good chance for you to check in with your provider about disease prevention and staying healthy. In between checkups, there are plenty of things you can do on your own. Experts have done a lot of research about which lifestyle changes and preventive measures are most likely to keep you healthy. Ask your health care provider for more information. Weight and diet Eat a healthy diet  Be sure to include plenty of vegetables, fruits, low-fat dairy products, and lean protein.  Do not eat a lot of foods high in solid fats, added sugars, or salt.  Get regular exercise. This is one of the most important things you can do for your health. ? Most adults should exercise for at least 150 minutes each week. The exercise should increase your heart rate and make you sweat (moderate-intensity exercise). ? Most adults should also do strengthening exercises at least twice a week. This is in addition to the moderate-intensity exercise.  Maintain a healthy weight  Body mass index (BMI) is a measurement that can be used to identify possible weight problems. It estimates body fat based on height and weight. Your health care provider can help determine your BMI and help you achieve or maintain a healthy weight.  For females 32 years of age and older: ? A BMI below 18.5 is considered underweight. ? A BMI of 18.5 to 24.9 is normal. ? A BMI of 25 to 29.9 is considered overweight. ? A  BMI of 30 and above is considered obese.  Watch levels of cholesterol and blood lipids  You should start having your blood tested for lipids and cholesterol at 60 years of age, then have this test every 5 years.  You may need to have your cholesterol levels checked more often if: ? Your lipid or cholesterol levels are high. ? You are older than 60 years of age. ? You are at high risk for heart disease.  Cancer screening Lung Cancer  Lung cancer screening is recommended for adults 24-7 years old who are at high risk for lung cancer because of a history of smoking.  A yearly low-dose CT scan of the lungs is recommended for people who: ? Currently smoke. ? Have quit within the past 15 years. ? Have at least a 30-pack-year history of smoking. A pack year is smoking an average of one pack of cigarettes a day for 1 year.  Yearly screening should continue until it has been 15 years since you quit.  Yearly screening should stop if you develop a health problem that would prevent you from having lung cancer treatment.  Breast Cancer  Practice breast self-awareness. This means understanding how your breasts normally appear and feel.  It also means doing regular breast self-exams. Let your health care provider know about any changes, no matter how small.  If you are in your 20s or 30s, you should have a clinical breast exam (CBE) by a health care provider  every 1-3 years as part of a regular health exam.  If you are 41 or older, have a CBE every year. Also consider having a breast X-ray (mammogram) every year.  If you have a family history of breast cancer, talk to your health care provider about genetic screening.  If you are at high risk for breast cancer, talk to your health care provider about having an MRI and a mammogram every year.  Breast cancer gene (BRCA) assessment is recommended for women who have family members with BRCA-related cancers. BRCA-related cancers  include: ? Breast. ? Ovarian. ? Tubal. ? Peritoneal cancers.  Results of the assessment will determine the need for genetic counseling and BRCA1 and BRCA2 testing.  Cervical Cancer Your health care provider may recommend that you be screened regularly for cancer of the pelvic organs (ovaries, uterus, and vagina). This screening involves a pelvic examination, including checking for microscopic changes to the surface of your cervix (Pap test). You may be encouraged to have this screening done every 3 years, beginning at age 65.  For women ages 44-65, health care providers may recommend pelvic exams and Pap testing every 3 years, or they may recommend the Pap and pelvic exam, combined with testing for human papilloma virus (HPV), every 5 years. Some types of HPV increase your risk of cervical cancer. Testing for HPV may also be done on women of any age with unclear Pap test results.  Other health care providers may not recommend any screening for nonpregnant women who are considered low risk for pelvic cancer and who do not have symptoms. Ask your health care provider if a screening pelvic exam is right for you.  If you have had past treatment for cervical cancer or a condition that could lead to cancer, you need Pap tests and screening for cancer for at least 20 years after your treatment. If Pap tests have been discontinued, your risk factors (such as having a new sexual partner) need to be reassessed to determine if screening should resume. Some women have medical problems that increase the chance of getting cervical cancer. In these cases, your health care provider may recommend more frequent screening and Pap tests.  Colorectal Cancer  This type of cancer can be detected and often prevented.  Routine colorectal cancer screening usually begins at 60 years of age and continues through 60 years of age.  Your health care provider may recommend screening at an earlier age if you have risk factors  for colon cancer.  Your health care provider may also recommend using home test kits to check for hidden blood in the stool.  A small camera at the end of a tube can be used to examine your colon directly (sigmoidoscopy or colonoscopy). This is done to check for the earliest forms of colorectal cancer.  Routine screening usually begins at age 19.  Direct examination of the colon should be repeated every 5-10 years through 60 years of age. However, you may need to be screened more often if early forms of precancerous polyps or small growths are found.  Skin Cancer  Check your skin from head to toe regularly.  Tell your health care provider about any new moles or changes in moles, especially if there is a change in a mole's shape or color.  Also tell your health care provider if you have a mole that is larger than the size of a pencil eraser.  Always use sunscreen. Apply sunscreen liberally and repeatedly throughout the day.  Protect yourself by wearing long sleeves, pants, a wide-brimmed hat, and sunglasses whenever you are outside.  Heart disease, diabetes, and high blood pressure  High blood pressure causes heart disease and increases the risk of stroke. High blood pressure is more likely to develop in: ? People who have blood pressure in the high end of the normal range (130-139/85-89 mm Hg). ? People who are overweight or obese. ? People who are African American.  If you are 57-39 years of age, have your blood pressure checked every 3-5 years. If you are 19 years of age or older, have your blood pressure checked every year. You should have your blood pressure measured twice-once when you are at a hospital or clinic, and once when you are not at a hospital or clinic. Record the average of the two measurements. To check your blood pressure when you are not at a hospital or clinic, you can use: ? An automated blood pressure machine at a pharmacy. ? A home blood pressure monitor.  If  you are between 19 years and 38 years old, ask your health care provider if you should take aspirin to prevent strokes.  Have regular diabetes screenings. This involves taking a blood sample to check your fasting blood sugar level. ? If you are at a normal weight and have a low risk for diabetes, have this test once every three years after 60 years of age. ? If you are overweight and have a high risk for diabetes, consider being tested at a younger age or more often. Preventing infection Hepatitis B  If you have a higher risk for hepatitis B, you should be screened for this virus. You are considered at high risk for hepatitis B if: ? You were born in a country where hepatitis B is common. Ask your health care provider which countries are considered high risk. ? Your parents were born in a high-risk country, and you have not been immunized against hepatitis B (hepatitis B vaccine). ? You have HIV or AIDS. ? You use needles to inject street drugs. ? You live with someone who has hepatitis B. ? You have had sex with someone who has hepatitis B. ? You get hemodialysis treatment. ? You take certain medicines for conditions, including cancer, organ transplantation, and autoimmune conditions.  Hepatitis C  Blood testing is recommended for: ? Everyone born from [birth year] through 1964. ? Anyone with known risk factors for hepatitis C.  Sexually transmitted infections (STIs)  You should be screened for sexually transmitted infections (STIs) including gonorrhea and chlamydia if: ? You are sexually active and are younger than 60 years of age. ? You are older than 60 years of age and your health care provider tells you that you are at risk for this type of infection. ? Your sexual activity has changed since you were last screened and you are at an increased risk for chlamydia or gonorrhea. Ask your health care provider if you are at risk.  If you do not have HIV, but are at risk, it may be recommended  that you take a prescription medicine daily to prevent HIV infection. This is called pre-exposure prophylaxis (PrEP). You are considered at risk if: ? You are sexually active and do not regularly use condoms or know the HIV status of your partner(s). ? You take drugs by injection. ? You are sexually active with a partner who has HIV.  Talk with your health care provider about whether you are at high risk of  being infected with HIV. If you choose to begin PrEP, you should first be tested for HIV. You should then be tested every 3 months for as long as you are taking PrEP. Pregnancy  If you are premenopausal and you may become pregnant, ask your health care provider about preconception counseling.  If you may become pregnant, take 400 to 800 micrograms (mcg) of folic acid every day.  If you want to prevent pregnancy, talk to your health care provider about birth control (contraception). Osteoporosis and menopause  Osteoporosis is a disease in which the bones lose minerals and strength with aging. This can result in serious bone fractures. Your risk for osteoporosis can be identified using a bone density scan.  If you are 63 years of age or older, or if you are at risk for osteoporosis and fractures, ask your health care provider if you should be screened.  Ask your health care provider whether you should take a calcium or vitamin D supplement to lower your risk for osteoporosis.  Menopause may have certain physical symptoms and risks.  Hormone replacement therapy may reduce some of these symptoms and risks. Talk to your health care provider about whether hormone replacement therapy is right for you. Follow these instructions at home:  Schedule regular health, dental, and eye exams.  Stay current with your immunizations.  Do not use any tobacco products including cigarettes, chewing tobacco, or electronic cigarettes.  If you are pregnant, do not drink alcohol.  If you are  breastfeeding, limit how much and how often you drink alcohol.  Limit alcohol intake to no more than 1 drink per day for nonpregnant women. One drink equals 12 ounces of beer, 5 ounces of wine, or 1 ounces of hard liquor.  Do not use street drugs.  Do not share needles.  Ask your health care provider for help if you need support or information about quitting drugs.  Tell your health care provider if you often feel depressed.  Tell your health care provider if you have ever been abused or do not feel safe at home. This information is not intended to replace advice given to you by your health care provider. Make sure you discuss any questions you have with your health care provider. Document Released: 06/02/2011 Document Revised: 04/24/2016 Document Reviewed: 08/21/2015 Elsevier Interactive Patient Education  [employer].

## 2017-06-29 NOTE — Assessment & Plan Note (Signed)
Chronic, stable. Continue current regimen. 

## 2017-07-06 ENCOUNTER — Other Ambulatory Visit (INDEPENDENT_AMBULATORY_CARE_PROVIDER_SITE_OTHER): Payer: 59

## 2017-07-06 DIAGNOSIS — Z1211 Encounter for screening for malignant neoplasm of colon: Secondary | ICD-10-CM | POA: Diagnosis not present

## 2017-07-06 LAB — FECAL OCCULT BLOOD, GUAIAC: FECAL OCCULT BLD: NEGATIVE

## 2017-07-06 LAB — FECAL OCCULT BLOOD, IMMUNOCHEMICAL: FECAL OCCULT BLD: NEGATIVE

## 2017-07-07 ENCOUNTER — Telehealth: Payer: Self-pay

## 2017-07-07 ENCOUNTER — Encounter: Payer: Self-pay | Admitting: Family Medicine

## 2017-07-07 MED ORDER — BENZONATATE 100 MG PO CAPS: 100.0000 mg | ORAL_CAPSULE | Freq: Three times a day (TID) | ORAL | 0 refills | Status: DC | PRN

## 2017-07-07 MED ORDER — BENZONATATE 100 MG PO CAPS: 100.0000 mg | ORAL_CAPSULE | Freq: Two times a day (BID) | ORAL | 0 refills | Status: DC | PRN

## 2017-07-07 NOTE — Telephone Encounter (Signed)
Spoke with patient today about lab results. Patient stated she is still coughing wanted to know if Dr. Sharen HonesGutierrez will send in Tessalon pearls for her cough. Per Dr. Sharen HonesGutierrez to send in to pharmacy  TID PRN #[id number]. Patient notified and expressed understanding.

## 2017-07-13 ENCOUNTER — Ambulatory Visit
Admission: RE | Admit: 2017-07-13 | Discharge: 2017-07-13 | Disposition: A | Discharge status: Home / Self Care | Payer: 59 | Source: Ambulatory Visit | Attending: Family Medicine | Admitting: Family Medicine

## 2017-07-13 ENCOUNTER — Encounter: Payer: Self-pay | Admitting: *Deleted

## 2017-07-13 DIAGNOSIS — Z1231 Encounter for screening mammogram for malignant neoplasm of breast: Secondary | ICD-10-CM | POA: Diagnosis not present

## 2017-08-24 ENCOUNTER — Other Ambulatory Visit: Payer: Self-pay | Admitting: Family Medicine

## 2017-12-03 DIAGNOSIS — G43111 Migraine with aura, intractable, with status migrainosus: Secondary | ICD-10-CM | POA: Diagnosis not present

## 2017-12-03 DIAGNOSIS — G43719 Chronic migraine without aura, intractable, without status migrainosus: Secondary | ICD-10-CM | POA: Diagnosis not present

## 2017-12-03 DIAGNOSIS — G43019 Migraine without aura, intractable, without status migrainosus: Secondary | ICD-10-CM | POA: Diagnosis not present

## 2017-12-16 DIAGNOSIS — T148XXA Other injury of unspecified body region, initial encounter: Secondary | ICD-10-CM | POA: Diagnosis not present

## 2017-12-16 DIAGNOSIS — M84374A Stress fracture, right foot, initial encounter for fracture: Secondary | ICD-10-CM | POA: Diagnosis not present

## 2017-12-24 DIAGNOSIS — M7671 Peroneal tendinitis, right leg: Secondary | ICD-10-CM | POA: Diagnosis not present

## 2017-12-24 DIAGNOSIS — M84374D Stress fracture, right foot, subsequent encounter for fracture with routine healing: Secondary | ICD-10-CM | POA: Diagnosis not present

## 2017-12-30 ENCOUNTER — Other Ambulatory Visit: Payer: Self-pay | Admitting: Family Medicine

## 2018-01-07 DIAGNOSIS — M84374D Stress fracture, right foot, subsequent encounter for fracture with routine healing: Secondary | ICD-10-CM | POA: Diagnosis not present

## 2018-06-01 DIAGNOSIS — G43719 Chronic migraine without aura, intractable, without status migrainosus: Secondary | ICD-10-CM | POA: Diagnosis not present

## 2018-06-01 DIAGNOSIS — G43019 Migraine without aura, intractable, without status migrainosus: Secondary | ICD-10-CM | POA: Diagnosis not present

## 2018-06-01 DIAGNOSIS — G43111 Migraine with aura, intractable, with status migrainosus: Secondary | ICD-10-CM | POA: Diagnosis not present

## 2018-06-27 ENCOUNTER — Other Ambulatory Visit: Payer: Self-pay | Admitting: Family Medicine

## 2018-06-30 ENCOUNTER — Other Ambulatory Visit: Payer: Self-pay | Admitting: Family Medicine

## 2018-06-30 DIAGNOSIS — I10 Essential (primary) hypertension: Secondary | ICD-10-CM

## 2018-06-30 DIAGNOSIS — E559 Vitamin D deficiency, unspecified: Secondary | ICD-10-CM

## 2018-07-01 ENCOUNTER — Other Ambulatory Visit (INDEPENDENT_AMBULATORY_CARE_PROVIDER_SITE_OTHER): Payer: 59

## 2018-07-01 DIAGNOSIS — E559 Vitamin D deficiency, unspecified: Secondary | ICD-10-CM | POA: Diagnosis not present

## 2018-07-01 DIAGNOSIS — I10 Essential (primary) hypertension: Secondary | ICD-10-CM | POA: Diagnosis not present

## 2018-07-01 LAB — LIPID PANEL
Cholesterol: 183 mg/dL (ref 0–200)
HDL: 56.6 mg/dL (ref >39.00)
LDL Cholesterol: 104 mg/dL — ABNORMAL HIGH (ref 0–99)
NONHDL: 126.1
Total CHOL/HDL Ratio: 3
Triglycerides: 109 mg/dL (ref 0.0–149.0)
VLDL: 21.8 mg/dL (ref 0.0–40.0)

## 2018-07-01 LAB — BASIC METABOLIC PANEL
BUN: 17 mg/dL (ref 6–23)
CO2: 28 mEq/L (ref 19–32)
Calcium: 9.4 mg/dL (ref 8.4–10.5)
Chloride: 107 mEq/L (ref 96–112)
Creatinine, Ser: 0.87 mg/dL (ref 0.40–1.20)
GFR: 85.15 mL/min (ref >60.00)
Glucose, Bld: 104 mg/dL — ABNORMAL HIGH (ref 70–99)
Potassium: 3.6 mEq/L (ref 3.5–5.1)
SODIUM: 142 meq/L (ref 135–145)

## 2018-07-01 LAB — VITAMIN D 25 HYDROXY (VIT D DEFICIENCY, FRACTURES): VITD: 24.37 ng/mL — AB (ref 30.00–100.00)

## 2018-07-07 ENCOUNTER — Ambulatory Visit (INDEPENDENT_AMBULATORY_CARE_PROVIDER_SITE_OTHER): Payer: 59 | Admitting: Family Medicine

## 2018-07-07 ENCOUNTER — Other Ambulatory Visit (HOSPITAL_COMMUNITY)
Admission: RE | Admit: 2018-07-07 | Discharge: 2018-07-07 | Disposition: A | Discharge status: Home / Self Care | Payer: 59 | Source: Ambulatory Visit | Attending: Family Medicine | Admitting: Family Medicine

## 2018-07-07 ENCOUNTER — Encounter: Payer: Self-pay | Admitting: Family Medicine

## 2018-07-07 VITALS — BP 122/84 | HR 59 | Temp 98.3°F | Ht 61.5 in | Wt 169.0 lb

## 2018-07-07 DIAGNOSIS — E669 Obesity, unspecified: Secondary | ICD-10-CM | POA: Diagnosis not present

## 2018-07-07 DIAGNOSIS — Z0000 Encounter for general adult medical examination without abnormal findings: Secondary | ICD-10-CM | POA: Diagnosis not present

## 2018-07-07 DIAGNOSIS — R011 Cardiac murmur, unspecified: Secondary | ICD-10-CM | POA: Diagnosis not present

## 2018-07-07 DIAGNOSIS — Z1151 Encounter for screening for human papillomavirus (HPV): Secondary | ICD-10-CM | POA: Insufficient documentation

## 2018-07-07 DIAGNOSIS — I10 Essential (primary) hypertension: Secondary | ICD-10-CM

## 2018-07-07 DIAGNOSIS — Z23 Encounter for immunization: Secondary | ICD-10-CM | POA: Diagnosis not present

## 2018-07-07 DIAGNOSIS — E559 Vitamin D deficiency, unspecified: Secondary | ICD-10-CM | POA: Diagnosis not present

## 2018-07-07 DIAGNOSIS — Z1211 Encounter for screening for malignant neoplasm of colon: Secondary | ICD-10-CM

## 2018-07-07 DIAGNOSIS — K219 Gastro-esophageal reflux disease without esophagitis: Secondary | ICD-10-CM

## 2018-07-07 MED ORDER — ATENOLOL 100 MG PO TABS: 100.0000 mg | ORAL_TABLET | Freq: Every day | ORAL | 3 refills | Status: DC

## 2018-07-07 MED ORDER — DIOVAN HCT 160-12.5 MG PO TABS: 1.0000 | ORAL_TABLET | Freq: Every day | ORAL | 3 refills | Status: DC

## 2018-07-07 MED ORDER — CHOLECALCIFEROL 1.25 MG (50000 UT) PO CAPS: 50000.0000 [IU] | ORAL_CAPSULE | ORAL | 1 refills | Status: DC

## 2018-07-07 NOTE — Assessment & Plan Note (Signed)
Stable exam.

## 2018-07-07 NOTE — Assessment & Plan Note (Signed)
Refilled weekly supplement - does better remembering this.

## 2018-07-07 NOTE — Patient Instructions (Addendum)
Pass by lab to pick up stool kit. I'm sorry about your dog! You are doing well today. Return as needed or in 1 year for next physical. Health Maintenance, Female Adopting a healthy lifestyle and getting preventive care can go a long way to promote health and wellness. Talk with your health care provider about what schedule of regular examinations is right for you. This is a good chance for you to check in with your provider about disease prevention and staying healthy. In between checkups, there are plenty of things you can do on your own. Experts have done a lot of research about which lifestyle changes and preventive measures are most likely to keep you healthy. Ask your health care provider for more information. Weight and diet Eat a healthy diet  Be sure to include plenty of vegetables, fruits, low-fat dairy products, and lean protein.  Do not eat a lot of foods high in solid fats, added sugars, or salt.  Get regular exercise. This is one of the most important things you can do for your health. ? Most adults should exercise for at least 150 minutes each week. The exercise should increase your heart rate and make you sweat (moderate-intensity exercise). ? Most adults should also do strengthening exercises at least twice a week. This is in addition to the moderate-intensity exercise.  Maintain a healthy weight  Body mass index (BMI) is a measurement that can be used to identify possible weight problems. It estimates body fat based on height and weight. Your health care provider can help determine your BMI and help you achieve or maintain a healthy weight.  For females 35 years of age and older: ? A BMI below 18.5 is considered underweight. ? A BMI of 18.5 to 24.9 is normal. ? A BMI of 25 to 29.9 is considered overweight. ? A BMI of 30 and above is considered obese.  Watch levels of cholesterol and blood lipids  You should start having your blood tested for lipids and cholesterol at 61  years of age, then have this test every 5 years.  You may need to have your cholesterol levels checked more often if: ? Your lipid or cholesterol levels are high. ? You are older than 61 years of age. ? You are at high risk for heart disease.  Cancer screening Lung Cancer  Lung cancer screening is recommended for adults 61-82 years old who are at high risk for lung cancer because of a history of smoking.  A yearly low-dose CT scan of the lungs is recommended for people who: ? Currently smoke. ? Have quit within the past 15 years. ? Have at least a 30-pack-year history of smoking. A pack year is smoking an average of one pack of cigarettes a day for 1 year.  Yearly screening should continue until it has been 15 years since you quit.  Yearly screening should stop if you develop a health problem that would prevent you from having lung cancer treatment.  Breast Cancer  Practice breast self-awareness. This means understanding how your breasts normally appear and feel.  It also means doing regular breast self-exams. Let your health care provider know about any changes, no matter how small.  If you are in your 20s or 30s, you should have a clinical breast exam (CBE) by a health care provider every 1-3 years as part of a regular health exam.  If you are 40 or older, have a CBE every year. Also consider having a breast X-ray (mammogram)  every year.  If you have a family history of breast cancer, talk to your health care provider about genetic screening.  If you are at high risk for breast cancer, talk to your health care provider about having an MRI and a mammogram every year.  Breast cancer gene (BRCA) assessment is recommended for women who have family members with BRCA-related cancers. BRCA-related cancers include: ? Breast. ? Ovarian. ? Tubal. ? Peritoneal cancers.  Results of the assessment will determine the need for genetic counseling and BRCA1 and BRCA2 testing.  Cervical  Cancer Your health care provider may recommend that you be screened regularly for cancer of the pelvic organs (ovaries, uterus, and vagina). This screening involves a pelvic examination, including checking for microscopic changes to the surface of your cervix (Pap test). You may be encouraged to have this screening done every 3 years, beginning at age 61.  For women ages 61-65, health care providers may recommend pelvic exams and Pap testing every 3 years, or they may recommend the Pap and pelvic exam, combined with testing for human papilloma virus (HPV), every 5 years. Some types of HPV increase your risk of cervical cancer. Testing for HPV may also be done on women of any age with unclear Pap test results.  Other health care providers may not recommend any screening for nonpregnant women who are considered low risk for pelvic cancer and who do not have symptoms. Ask your health care provider if a screening pelvic exam is right for you.  If you have had past treatment for cervical cancer or a condition that could lead to cancer, you need Pap tests and screening for cancer for at least 20 years after your treatment. If Pap tests have been discontinued, your risk factors (such as having a new sexual partner) need to be reassessed to determine if screening should resume. Some women have medical problems that increase the chance of getting cervical cancer. In these cases, your health care provider may recommend more frequent screening and Pap tests.  Colorectal Cancer  This type of cancer can be detected and often prevented.  Routine colorectal cancer screening usually begins at 61 years of age and continues through 61 years of age.  Your health care provider may recommend screening at an earlier age if you have risk factors for colon cancer.  Your health care provider may also recommend using home test kits to check for hidden blood in the stool.  A small camera at the end of a tube can be used to  examine your colon directly (sigmoidoscopy or colonoscopy). This is done to check for the earliest forms of colorectal cancer.  Routine screening usually begins at age 61.  Direct examination of the colon should be repeated every 5-10 years through 61 years of age. However, you may need to be screened more often if early forms of precancerous polyps or small growths are found.  Skin Cancer  Check your skin from head to toe regularly.  Tell your health care provider about any new moles or changes in moles, especially if there is a change in a mole's shape or color.  Also tell your health care provider if you have a mole that is larger than the size of a pencil eraser.  Always use sunscreen. Apply sunscreen liberally and repeatedly throughout the day.  Protect yourself by wearing long sleeves, pants, a wide-brimmed hat, and sunglasses whenever you are outside.  Heart disease, diabetes, and high blood pressure  High blood pressure causes  heart disease and increases the risk of stroke. High blood pressure is more likely to develop in: ? People who have blood pressure in the high end of the normal range (130-139/85-89 mm Hg). ? People who are overweight or obese. ? People who are African American.  If you are 51-35 years of age, have your blood pressure checked every 3-5 years. If you are 45 years of age or older, have your blood pressure checked every year. You should have your blood pressure measured twice-once when you are at a hospital or clinic, and once when you are not at a hospital or clinic. Record the average of the two measurements. To check your blood pressure when you are not at a hospital or clinic, you can use: ? An automated blood pressure machine at a pharmacy. ? A home blood pressure monitor.  If you are between 108 years and 69 years old, ask your health care provider if you should take aspirin to prevent strokes.  Have regular diabetes screenings. This involves taking a  blood sample to check your fasting blood sugar level. ? If you are at a normal weight and have a low risk for diabetes, have this test once every three years after 61 years of age. ? If you are overweight and have a high risk for diabetes, consider being tested at a younger age or more often. Preventing infection Hepatitis B  If you have a higher risk for hepatitis B, you should be screened for this virus. You are considered at high risk for hepatitis B if: ? You were born in a country where hepatitis B is common. Ask your health care provider which countries are considered high risk. ? Your parents were born in a high-risk country, and you have not been immunized against hepatitis B (hepatitis B vaccine). ? You have HIV or AIDS. ? You use needles to inject street drugs. ? You live with someone who has hepatitis B. ? You have had sex with someone who has hepatitis B. ? You get hemodialysis treatment. ? You take certain medicines for conditions, including cancer, organ transplantation, and autoimmune conditions.  Hepatitis C  Blood testing is recommended for: ? Everyone born from [birth year] through 1964. ? Anyone with known risk factors for hepatitis C.  Sexually transmitted infections (STIs)  You should be screened for sexually transmitted infections (STIs) including gonorrhea and chlamydia if: ? You are sexually active and are younger than 61 years of age. ? You are older than 61 years of age and your health care provider tells you that you are at risk for this type of infection. ? Your sexual activity has changed since you were last screened and you are at an increased risk for chlamydia or gonorrhea. Ask your health care provider if you are at risk.  If you do not have HIV, but are at risk, it may be recommended that you take a prescription medicine daily to prevent HIV infection. This is called pre-exposure prophylaxis (PrEP). You are considered at risk if: ? You are sexually active and  do not regularly use condoms or know the HIV status of your partner(s). ? You take drugs by injection. ? You are sexually active with a partner who has HIV.  Talk with your health care provider about whether you are at high risk of being infected with HIV. If you choose to begin PrEP, you should first be tested for HIV. You should then be tested every 3 months for as long as  you are taking PrEP. Pregnancy  If you are premenopausal and you may become pregnant, ask your health care provider about preconception counseling.  If you may become pregnant, take 400 to 800 micrograms (mcg) of folic acid every day.  If you want to prevent pregnancy, talk to your health care provider about birth control (contraception). Osteoporosis and menopause  Osteoporosis is a disease in which the bones lose minerals and strength with aging. This can result in serious bone fractures. Your risk for osteoporosis can be identified using a bone density scan.  If you are 77 years of age or older, or if you are at risk for osteoporosis and fractures, ask your health care provider if you should be screened.  Ask your health care provider whether you should take a calcium or vitamin D supplement to lower your risk for osteoporosis.  Menopause may have certain physical symptoms and risks.  Hormone replacement therapy may reduce some of these symptoms and risks. Talk to your health care provider about whether hormone replacement therapy is right for you. Follow these instructions at home:  Schedule regular health, dental, and eye exams.  Stay current with your immunizations.  Do not use any tobacco products including cigarettes, chewing tobacco, or electronic cigarettes.  If you are pregnant, do not drink alcohol.  If you are breastfeeding, limit how much and how often you drink alcohol.  Limit alcohol intake to no more than 1 drink per day for nonpregnant women. One drink equals 12 ounces of beer, 5 ounces of  wine, or 1 ounces of hard liquor.  Do not use street drugs.  Do not share needles.  Ask your health care provider for help if you need support or information about quitting drugs.  Tell your health care provider if you often feel depressed.  Tell your health care provider if you have ever been abused or do not feel safe at home. This information is not intended to replace advice given to you by your health care provider. Make sure you discuss any questions you have with your health care provider. Document Released: 06/02/2011 Document Revised: 04/24/2016 Document Reviewed: 08/21/2015 Elsevier Interactive Patient Education  [employer].

## 2018-07-07 NOTE — Assessment & Plan Note (Signed)
Preventative protocols reviewed and updated unless pt declined. Discussed healthy diet and lifestyle.  

## 2018-07-07 NOTE — Progress Notes (Addendum)
BP 122/84 (BP Location: Left Arm, Patient Position: Sitting, Cuff Size: Normal)   Pulse (!) 59   Temp 98.3 F (36.8 C) (Oral)   Ht 5' 1.5" (1.562 m)   Wt 169 lb (76.7 kg)   LMP 05/26/2015   SpO2 98%   BMI 31.42 kg/m    CC: CPE Subjective:    Patient ID: Julie Baker, female    DOB: 08-17-1956, 62 y.o.   MRN: [id number]  HPI: Julie DiceDebra C Wan is a 62 y.o. female presenting on 07/07/2018 for Annual Exam   Just came from vet - had to put dog to sleep (stomach mass).   Vit D def - forgets daily replacement - we did treat with 50k unit weekly course last year.   Recurrent cough - improved with zantac.  Requests brand diovan hct - generic was too large a pill.  fmhx GYN cancer - mother ovarian, aunt uterine.   Preventative: Colon screening - always normal stool kits. Requests rpt iFOB.  Mammogram - 07/2017 normal. Does not do breast exams at home.  Well woman - Qyearly pelvic exam, Q5040yr pap, today. +fmhx ovarian (mother) and uterine cancer (aunt).  LMP 05/2015. Denies vaginal bleeding Flu shot - does not get.  Tetanus 2009. Requests again today. Seat belt use discussed Sunscreen use discussed. No changing moles on skin Non smoker  Alcohol - none Dentist - Q6 mo Eye exam - yearly  Caffeine: 1 cup coffee/day  Married, lives with husband.  Occupation: [employer]  Activity: some yardwork, encouraged walking daliy Diet: encouraged to avoid salt and sugar in diet  Relevant past medical, surgical, family and social history reviewed and updated as indicated. Interim medical history since our last visit reviewed. Allergies and medications reviewed and updated. Outpatient Medications Prior to Visit  Medication Sig Dispense Refill  . cholecalciferol (VITAMIN D) 1000 UNITS tablet Take 1,000 Units by mouth daily.    . SUMAtriptan (IMITREX) 100 MG tablet Take one by mouth as directed as needed     . topiramate (TOPAMAX) 100 MG tablet Take 100 mg by mouth at bedtime.    Marland Kitchen.  atenolol (TENORMIN) 100 MG tablet TAKE 1 TABLET BY MOUTH EVERY DAY 90 tablet 4  . D3-50 50000 units capsule TAKE 1 TABLET BY MOUTH ONCE A WEEK. 12 capsule 1  . DIOVAN HCT 160-12.5 MG tablet TAKE 1 TABLET BY MOUTH EVERY DAY 90 tablet 0  . benzonatate (TESSALON) 100 MG capsule Take 1 capsule (100 mg total) by mouth 3 (three) times daily as needed for cough. 30 capsule 0   No facility-administered medications prior to visit.      Per HPI unless specifically indicated in ROS section below Review of Systems  Constitutional: Negative for activity change, appetite change, chills, fatigue, fever and unexpected weight change.  HENT: Negative for hearing loss.   Eyes: Negative for visual disturbance.  Respiratory: Negative for cough, chest tightness, shortness of breath and wheezing.   Cardiovascular: Negative for chest pain, palpitations and leg swelling.  Gastrointestinal: Negative for abdominal distention, abdominal pain, blood in stool, constipation, diarrhea, nausea and vomiting.  Genitourinary: Negative for difficulty urinating and hematuria.  Musculoskeletal: Negative for arthralgias, myalgias and neck pain.  Skin: Negative for rash.  Neurological: Negative for dizziness, seizures, syncope and headaches.  Hematological: Negative for adenopathy. Does not bruise/bleed easily.  Psychiatric/Behavioral: Negative for dysphoric mood. The patient is not nervous/anxious.        Objective:    BP 122/84 (BP Location: Left  Arm, Patient Position: Sitting, Cuff Size: Normal)   Pulse (!) 59   Temp 98.3 F (36.8 C) (Oral)   Ht 5' 1.5" (1.562 m)   Wt 169 lb (76.7 kg)   LMP 05/26/2015   SpO2 98%   BMI 31.42 kg/m   Wt Readings from Last 3 Encounters:  07/07/18 169 lb (76.7 kg)  06/29/17 169 lb 12 oz (77 kg)  05/22/17 168 lb 6.4 oz (76.4 kg)    Physical Exam  Constitutional: She is oriented to person, place, and time. She appears well-developed and well-nourished. No distress.  HENT:  Head:  Normocephalic and atraumatic.  Right Ear: Hearing, tympanic membrane, external ear and ear canal normal.  Left Ear: Hearing, tympanic membrane, external ear and ear canal normal.  Nose: Nose normal.  Mouth/Throat: Uvula is midline, oropharynx is clear and moist and mucous membranes are normal. No oropharyngeal exudate, posterior oropharyngeal edema or posterior oropharyngeal erythema.  Eyes: Pupils are equal, round, and reactive to light. Conjunctivae and EOM are normal. No scleral icterus.  Neck: Normal range of motion. Neck supple. No thyromegaly present.  Cardiovascular: Normal rate, regular rhythm and intact distal pulses.  Murmur (2/6 systolic at RUSB) heard. Pulses:      Radial pulses are 2+ on the right side, and 2+ on the left side.  Pulmonary/Chest: Effort normal and breath sounds normal. No respiratory distress. She has no wheezes. She has no rales. Right breast exhibits no inverted nipple, no mass, no nipple discharge, no skin change and no tenderness. Left breast exhibits no inverted nipple, no mass, no nipple discharge, no skin change and no tenderness.  Abdominal: Soft. Bowel sounds are normal. She exhibits no distension and no mass. There is no tenderness. There is no rebound and no guarding.  Genitourinary: Vagina normal and uterus normal. Pelvic exam was performed with patient supine. There is no rash, tenderness or lesion on the right labia. There is no rash, tenderness or lesion on the left labia. Cervix exhibits no motion tenderness. Right adnexum displays no mass, no tenderness and no fullness. Left adnexum displays no mass, no tenderness and no fullness.  Genitourinary Comments: Pap performed on cervix  Musculoskeletal: Normal range of motion. She exhibits no edema.  Lymphadenopathy:       Head (right side): No submental, no submandibular, no tonsillar, no preauricular, no posterior auricular and no occipital adenopathy present.       Head (left side): No submental, no  submandibular, no tonsillar, no preauricular, no posterior auricular and no occipital adenopathy present.    She has no cervical adenopathy.    She has no axillary adenopathy.       Right: No supraclavicular adenopathy present.       Left: No supraclavicular adenopathy present.  Neurological: She is alert and oriented to person, place, and time.  CN grossly intact, station and gait intact  Skin: Skin is warm and dry. No rash noted.  Psychiatric: She has a normal mood and affect. Her behavior is normal. Judgment and thought content normal.  Nursing note and vitals reviewed.  Results for orders placed or performed in visit on 07/01/18  Basic metabolic panel  Result Value Ref Range   Sodium 142 135 - 145 mEq/L   Potassium 3.6 3.5 - 5.1 mEq/L   Chloride 107 96 - 112 mEq/L   CO2 28 19 - 32 mEq/L   Glucose, Bld 104 (H) 70 - 99 mg/dL   BUN 17 6 - 23 mg/dL   Creatinine,  Ser 0.87 0.40 - 1.20 mg/dL   Calcium 9.4 8.4 - 47.8 mg/dL   GFR 29.56 >21.30 mL/min  Lipid panel  Result Value Ref Range   Cholesterol 183 0 - 200 mg/dL   Triglycerides 865.7 0.0 - 149.0 mg/dL   HDL 84.69 >62.95 mg/dL   VLDL 28.4 0.0 - 13.2 mg/dL   LDL Cholesterol 440 (H) 0 - 99 mg/dL   Total CHOL/HDL Ratio 3    NonHDL 126.10   VITAMIN D 25 Hydroxy (Vit-D Deficiency, Fractures)  Result Value Ref Range   VITD 24.37 (L) 30.00 - 100.00 ng/mL      Assessment & Plan:   Problem List Items Addressed This Visit    Vitamin D deficiency    Refilled weekly supplement - does better remembering this.       SYSTOLIC MURMUR    Stable exam      Obesity (BMI 30.0-34.9)    Encouraged healthy diet and lifestyle changes to affect sustainable weight loss.       Healthcare maintenance - Primary    Preventative protocols reviewed and updated unless pt declined. Discussed healthy diet and lifestyle.       GERD (gastroesophageal reflux disease)    Presents with cough. Improved on zantac, not currently bothering her but she  notes more trouble with spicy foods.      Essential hypertension    Chronic, stable. Continue current regimen.       Relevant Medications   DIOVAN HCT 160-12.5 MG tablet   atenolol (TENORMIN) 100 MG tablet       Meds ordered this encounter  Medications  . DIOVAN HCT 160-12.5 MG tablet    Sig: Take 1 tablet by mouth daily.    Dispense:  90 tablet    Refill:  3  . atenolol (TENORMIN) 100 MG tablet    Sig: Take 1 tablet (100 mg total) by mouth daily.    Dispense:  90 tablet    Refill:  3  . Cholecalciferol (D3-50) 50000 units capsule    Sig: Take 1 capsule (50,000 Units total) by mouth once a week.    Dispense:  12 capsule    Refill:  1   No orders of the defined types were placed in this encounter.   Follow up plan: Return in about 1 year (around 07/08/2019) for annual exam, prior fasting for blood work.  Eustaquio Boyden, MD

## 2018-07-07 NOTE — Assessment & Plan Note (Signed)
Presents with cough. Improved on zantac, not currently bothering her but she notes more trouble with spicy foods.

## 2018-07-07 NOTE — Assessment & Plan Note (Signed)
Chronic, stable. Continue current regimen. 

## 2018-07-07 NOTE — Assessment & Plan Note (Signed)
Encouraged healthy diet and lifestyle changes to affect sustainable weight loss.  

## 2018-07-07 NOTE — Addendum Note (Signed)
Addended by: Nanci PinaGOINS, Yajaira Doffing on: 07/07/2018 11:55 AM   Modules accepted: Orders

## 2018-07-07 NOTE — Addendum Note (Signed)
Addended by: Eustaquio BoydenGUTIERREZ, Oshea Percival on: 07/07/2018 11:57 AM   Modules accepted: Orders

## 2018-07-08 ENCOUNTER — Other Ambulatory Visit: Payer: Self-pay | Admitting: Family Medicine

## 2018-07-08 DIAGNOSIS — Z1231 Encounter for screening mammogram for malignant neoplasm of breast: Secondary | ICD-10-CM

## 2018-07-08 LAB — CYTOLOGY - PAP
DIAGNOSIS: NEGATIVE
HPV (WINDOPATH): NOT DETECTED

## 2018-07-15 ENCOUNTER — Other Ambulatory Visit (INDEPENDENT_AMBULATORY_CARE_PROVIDER_SITE_OTHER): Payer: 59

## 2018-07-15 ENCOUNTER — Encounter: Payer: Self-pay | Admitting: Family Medicine

## 2018-07-15 DIAGNOSIS — Z1211 Encounter for screening for malignant neoplasm of colon: Secondary | ICD-10-CM

## 2018-07-15 LAB — FECAL OCCULT BLOOD, IMMUNOCHEMICAL: Fecal Occult Bld: NEGATIVE

## 2018-07-15 LAB — FECAL OCCULT BLOOD, GUAIAC: FECAL OCCULT BLD: NEGATIVE

## 2018-08-04 ENCOUNTER — Ambulatory Visit
Admission: RE | Admit: 2018-08-04 | Discharge: 2018-08-04 | Disposition: A | Discharge status: Home / Self Care | Payer: 59 | Source: Ambulatory Visit | Attending: Family Medicine | Admitting: Family Medicine

## 2018-08-04 DIAGNOSIS — Z1231 Encounter for screening mammogram for malignant neoplasm of breast: Secondary | ICD-10-CM | POA: Diagnosis not present

## 2018-08-04 LAB — HM MAMMOGRAPHY

## 2018-08-05 ENCOUNTER — Encounter: Payer: Self-pay | Admitting: Family Medicine

## 2018-11-11 ENCOUNTER — Telehealth: Payer: Self-pay

## 2018-11-11 NOTE — Telephone Encounter (Signed)
Received letter from [facility] stating Diovan HCT 160-12.5 mg tab will no longer be covered by ins co as of 12/01/2018.  Covered meds include:   Candesartan-HCTZ Ibesartan-HCTZ Losartan-HCTZ Olmesartan-HCTZ Telmisartan-HCTZ Valsartan-HCTZ

## 2018-11-12 MED ORDER — VALSARTAN-HYDROCHLOROTHIAZIDE 160-12.5 MG PO TABS: 1.0000 | ORAL_TABLET | Freq: Every day | ORAL | Status: DC

## 2018-11-12 NOTE — Telephone Encounter (Signed)
Spoke with pt relaying Dr. Timoteo ExposeG's message and explaining reason for the change. Pt verbalizes understanding.

## 2018-11-12 NOTE — Telephone Encounter (Signed)
plz notify pt - I will send in valsartan hctz which is generic for diovan hct. Sent to pharmacy.

## 2018-12-08 DIAGNOSIS — G43019 Migraine without aura, intractable, without status migrainosus: Secondary | ICD-10-CM | POA: Diagnosis not present

## 2018-12-08 DIAGNOSIS — G43111 Migraine with aura, intractable, with status migrainosus: Secondary | ICD-10-CM | POA: Diagnosis not present

## 2018-12-08 DIAGNOSIS — G43719 Chronic migraine without aura, intractable, without status migrainosus: Secondary | ICD-10-CM | POA: Diagnosis not present

## 2018-12-11 ENCOUNTER — Other Ambulatory Visit: Payer: Self-pay | Admitting: Family Medicine

## 2018-12-16 ENCOUNTER — Other Ambulatory Visit: Payer: Self-pay | Admitting: Family Medicine

## 2018-12-16 NOTE — Addendum Note (Signed)
Addended by: Patience Musca on: 12/16/2018 10:37 AM   Modules accepted: Orders

## 2018-12-16 NOTE — Telephone Encounter (Signed)
Pt called back and is upset that she missed one night of atenolol; I advised pt that new rx was sent electronically to [facility] this morning. Pt has to be at work at 12 noon today and she will miss another dose of atenolol. I spoke with Abby at [facility] and rx will be ready in 15 '. Pt voiced understanding and will pick up rx.

## 2018-12-16 NOTE — Telephone Encounter (Signed)
Pharmacy states they do not have refill on file. Filled as requested

## 2019-02-23 ENCOUNTER — Telehealth: Payer: Self-pay

## 2019-02-23 NOTE — Telephone Encounter (Signed)
Received PA denial stating brand name is not a formulary drug. However, the generic is covered.   I spoke with [facility] explaining the provider prescribed the generic.  Says a note from the pharmacy when it was put through says the pt requested the brand name. I will contact the pt to explain the situation.

## 2019-02-23 NOTE — Telephone Encounter (Signed)
Received PA request from [facility] for valsartan-HCTZ (Diovan-HCT) 160-12.5 mg tab, key:  [id number], PA case ID:  [id number], Rx #:  [id number].  Decision pending.

## 2019-02-23 NOTE — Telephone Encounter (Signed)
Left a message on vm for pt to call back.    Need to explain her insurance will not cover the brand name Diovan HCT.  They will cover the generic valsartan-HCTZ. If she is ok with the generic, pt needs to call the pharmacy to let them know so they can run it through as the generic.

## 2019-02-24 NOTE — Telephone Encounter (Signed)
Spoke with pt explaining about the Diovan rx.  Pt verbalizes understanding and agrees to taking generic.    Spoke with [facility] informing them pt agrees to generic valsartan-HCTZ.  Says they will document that and fill rx for pt .

## 2019-07-07 ENCOUNTER — Telehealth: Payer: Self-pay

## 2019-07-07 NOTE — Telephone Encounter (Signed)
Left message to call clinic, needs COVID screen and back door lab info and front DR appt info

## 2019-07-10 ENCOUNTER — Other Ambulatory Visit: Payer: Self-pay | Admitting: Family Medicine

## 2019-07-10 DIAGNOSIS — E559 Vitamin D deficiency, unspecified: Secondary | ICD-10-CM

## 2019-07-10 DIAGNOSIS — I10 Essential (primary) hypertension: Secondary | ICD-10-CM

## 2019-07-11 ENCOUNTER — Other Ambulatory Visit (INDEPENDENT_AMBULATORY_CARE_PROVIDER_SITE_OTHER): Payer: 59

## 2019-07-11 ENCOUNTER — Other Ambulatory Visit: Payer: Self-pay

## 2019-07-11 DIAGNOSIS — E559 Vitamin D deficiency, unspecified: Secondary | ICD-10-CM

## 2019-07-11 DIAGNOSIS — I10 Essential (primary) hypertension: Secondary | ICD-10-CM

## 2019-07-11 LAB — COMPREHENSIVE METABOLIC PANEL
ALT: 14 U/L (ref 0–35)
AST: 15 U/L (ref 0–37)
Albumin: 4.2 g/dL (ref 3.5–5.2)
Alkaline Phosphatase: 102 U/L (ref 39–117)
BUN: 14 mg/dL (ref 6–23)
CO2: 28 mEq/L (ref 19–32)
Calcium: 9.5 mg/dL (ref 8.4–10.5)
Chloride: 106 mEq/L (ref 96–112)
Creatinine, Ser: 0.84 mg/dL (ref 0.40–1.20)
GFR: 83.14 mL/min (ref >60.00)
Glucose, Bld: 98 mg/dL (ref 70–99)
Potassium: 3.7 mEq/L (ref 3.5–5.1)
Sodium: 142 mEq/L (ref 135–145)
Total Bilirubin: 0.6 mg/dL (ref 0.2–1.2)
Total Protein: 7 g/dL (ref 6.0–8.3)

## 2019-07-11 LAB — LIPID PANEL
Cholesterol: 188 mg/dL (ref 0–200)
HDL: 55.2 mg/dL (ref >39.00)
LDL Cholesterol: 116 mg/dL — ABNORMAL HIGH (ref 0–99)
NonHDL: 133.11
Total CHOL/HDL Ratio: 3
Triglycerides: 86 mg/dL (ref 0.0–149.0)
VLDL: 17.2 mg/dL (ref 0.0–40.0)

## 2019-07-11 LAB — VITAMIN D 25 HYDROXY (VIT D DEFICIENCY, FRACTURES): VITD: 24.47 ng/mL — ABNORMAL LOW (ref 30.00–100.00)

## 2019-07-11 LAB — TSH: TSH: 4.5 u[IU]/mL (ref 0.35–4.50)

## 2019-07-13 ENCOUNTER — Other Ambulatory Visit: Payer: Self-pay | Admitting: Family Medicine

## 2019-07-13 DIAGNOSIS — Z1231 Encounter for screening mammogram for malignant neoplasm of breast: Secondary | ICD-10-CM

## 2019-07-14 ENCOUNTER — Other Ambulatory Visit: Payer: Self-pay

## 2019-07-14 ENCOUNTER — Encounter: Payer: Self-pay | Admitting: Family Medicine

## 2019-07-14 ENCOUNTER — Ambulatory Visit (INDEPENDENT_AMBULATORY_CARE_PROVIDER_SITE_OTHER): Payer: 59 | Admitting: Family Medicine

## 2019-07-14 VITALS — BP 140/76 | HR 60 | Temp 98.1°F | Ht 61.75 in | Wt 169.4 lb

## 2019-07-14 DIAGNOSIS — E66811 Obesity, class 1: Secondary | ICD-10-CM

## 2019-07-14 DIAGNOSIS — Z0000 Encounter for general adult medical examination without abnormal findings: Secondary | ICD-10-CM | POA: Diagnosis not present

## 2019-07-14 DIAGNOSIS — R011 Cardiac murmur, unspecified: Secondary | ICD-10-CM

## 2019-07-14 DIAGNOSIS — E669 Obesity, unspecified: Secondary | ICD-10-CM

## 2019-07-14 DIAGNOSIS — I10 Essential (primary) hypertension: Secondary | ICD-10-CM

## 2019-07-14 DIAGNOSIS — Z1211 Encounter for screening for malignant neoplasm of colon: Secondary | ICD-10-CM | POA: Diagnosis not present

## 2019-07-14 DIAGNOSIS — G43009 Migraine without aura, not intractable, without status migrainosus: Secondary | ICD-10-CM

## 2019-07-14 DIAGNOSIS — E559 Vitamin D deficiency, unspecified: Secondary | ICD-10-CM

## 2019-07-14 DIAGNOSIS — R002 Palpitations: Secondary | ICD-10-CM

## 2019-07-14 MED ORDER — SUMATRIPTAN SUCCINATE 100 MG PO TABS: 100.0000 mg | ORAL_TABLET | Freq: Once | ORAL | 3 refills | Status: DC

## 2019-07-14 MED ORDER — VALSARTAN-HYDROCHLOROTHIAZIDE 160-12.5 MG PO TABS: 1.0000 | ORAL_TABLET | Freq: Every day | ORAL | Status: DC

## 2019-07-14 MED ORDER — ATENOLOL 100 MG PO TABS: 100.0000 mg | ORAL_TABLET | Freq: Every day | ORAL | 3 refills | Status: DC

## 2019-07-14 MED ORDER — TOPIRAMATE 100 MG PO TABS: 100.0000 mg | ORAL_TABLET | Freq: Every day | ORAL | 3 refills | Status: DC

## 2019-07-14 NOTE — Assessment & Plan Note (Addendum)
Noticing heart fluttering at work over last few weeks. Denies dyspnea, chest pain, dizziness with this. Will monitor and let me know if worsening.

## 2019-07-14 NOTE — Assessment & Plan Note (Signed)
Chronic, stable. Continue current regimen. 

## 2019-07-14 NOTE — Assessment & Plan Note (Signed)
Encouraged healthy diet and lifestyle changes to affect sustainable weight loss.  

## 2019-07-14 NOTE — Assessment & Plan Note (Signed)
Encouraged daily vit D replacement.

## 2019-07-14 NOTE — Assessment & Plan Note (Signed)
Stable period on current regimen - continue topamax and imitrex.

## 2019-07-14 NOTE — Patient Instructions (Addendum)
Pass by lab to pick up stool kit.  You are doing well today. Return as needed or in 1 year for next physical. Let us know if heart fluttering getting more frequent or worse.   Health Maintenance for Postmenopausal Women Menopause is a normal process in which your ability to get pregnant comes to an end. This process happens slowly over many months or years, usually between the ages of 26 and 27. Menopause is complete when you have missed your menstrual periods for 12 months. It is important to talk with your health care provider about some of the most common conditions that affect women after menopause (postmenopausal women). These include heart disease, cancer, and bone loss (osteoporosis). Adopting a healthy lifestyle and getting preventive care can help to promote your health and wellness. The actions you take can also lower your chances of developing some of these common conditions. What should I know about menopause? During menopause, you may get a number of symptoms, such as:  Hot flashes. These can be moderate or severe.  Night sweats.  Decrease in sex drive.  Mood swings.  Headaches.  Tiredness.  Irritability.  Memory problems.  Insomnia. Choosing to treat or not to treat these symptoms is a decision that you make with your health care provider. Do I need hormone replacement therapy?  Hormone replacement therapy is effective in treating symptoms that are caused by menopause, such as hot flashes and night sweats.  Hormone replacement carries certain risks, especially as you become older. If you are thinking about using estrogen or estrogen with progestin, discuss the benefits and risks with your health care provider. What is my risk for heart disease and stroke? The risk of heart disease, heart attack, and stroke increases as you age. One of the causes may be a change in the body's hormones during menopause. This can affect how your body uses dietary fats, triglycerides, and  cholesterol. Heart attack and stroke are medical emergencies. There are many things that you can do to help prevent heart disease and stroke. Watch your blood pressure  High blood pressure causes heart disease and increases the risk of stroke. This is more likely to develop in people who have high blood pressure readings, are of African descent, or are overweight.  Have your blood pressure checked: ? Every 3-5 years if you are 17-51 years of age. ? Every year if you are 29 years old or older. Eat a healthy diet   Eat a diet that includes plenty of vegetables, fruits, low-fat dairy products, and lean protein.  Do not eat a lot of foods that are high in solid fats, added sugars, or sodium. Get regular exercise Get regular exercise. This is one of the most important things you can do for your health. Most adults should:  Try to exercise for at least 150 minutes each week. The exercise should increase your heart rate and make you sweat (moderate-intensity exercise).  Try to do strengthening exercises at least twice each week. Do these in addition to the moderate-intensity exercise.  Spend less time sitting. Even light physical activity can be beneficial. Other tips  Work with your health care provider to achieve or maintain a healthy weight.  Do not use any products that contain nicotine or tobacco, such as cigarettes, e-cigarettes, and chewing tobacco. If you need help quitting, ask your health care provider.  Know your numbers. Ask your health care provider to check your cholesterol and your blood sugar (glucose). Continue to have your  blood tested as directed by your health care provider. Do I need screening for cancer? Depending on your health history and family history, you may need to have cancer screening at different stages of your life. This may include screening for:  Breast cancer.  Cervical cancer.  Lung cancer.  Colorectal cancer. What is my risk for  osteoporosis? After menopause, you may be at increased risk for osteoporosis. Osteoporosis is a condition in which bone destruction happens more quickly than new bone creation. To help prevent osteoporosis or the bone fractures that can happen because of osteoporosis, you may take the following actions:  If you are 25-54 years old, get at least 1,000 mg of calcium and at least 600 mg of vitamin D per day.  If you are older than age 24 but younger than age 49, get at least 1,200 mg of calcium and at least 600 mg of vitamin D per day.  If you are older than age 7, get at least 1,200 mg of calcium and at least 800 mg of vitamin D per day. Smoking and drinking excessive alcohol increase the risk of osteoporosis. Eat foods that are rich in calcium and vitamin D, and do weight-bearing exercises several times each week as directed by your health care provider. How does menopause affect my mental health? Depression may occur at any age, but it is more common as you become older. Common symptoms of depression include:  Low or sad mood.  Changes in sleep patterns.  Changes in appetite or eating patterns.  Feeling an overall lack of motivation or enjoyment of activities that you previously enjoyed.  Frequent crying spells. Talk with your health care provider if you think that you are experiencing depression. General instructions See your health care provider for regular wellness exams and vaccines. This may include:  Scheduling regular health, dental, and eye exams.  Getting and maintaining your vaccines. These include: ? Influenza vaccine. Get this vaccine each year before the flu season begins. ? Pneumonia vaccine. ? Shingles vaccine. ? Tetanus, diphtheria, and pertussis (Tdap) booster vaccine. Your health care provider may also recommend other immunizations. Tell your health care provider if you have ever been abused or do not feel safe at home. Summary  Menopause is a normal process in  which your ability to get pregnant comes to an end.  This condition causes hot flashes, night sweats, decreased interest in sex, mood swings, headaches, or lack of sleep.  Treatment for this condition may include hormone replacement therapy.  Take actions to keep yourself healthy, including exercising regularly, eating a healthy diet, watching your weight, and checking your blood pressure and blood sugar levels.  Get screened for cancer and depression. Make sure that you are up to date with all your vaccines. This information is not intended to replace advice given to you by your health care provider. Make sure you discuss any questions you have with your health care provider. Document Released: 01/09/2006 Document Revised: 11/10/2018 Document Reviewed: 11/10/2018 Elsevier Patient Education  2020 [employer].

## 2019-07-14 NOTE — Assessment & Plan Note (Signed)
Preventative protocols reviewed and updated unless pt declined. Discussed healthy diet and lifestyle.  

## 2019-07-14 NOTE — Assessment & Plan Note (Signed)
Stable exam, pt asxs.

## 2019-07-14 NOTE — Progress Notes (Signed)
This visit was conducted in person.  BP 140/76 (BP Location: Left Arm, Patient Position: Sitting, Cuff Size: Normal)   Pulse 60   Temp 98.1 F (36.7 C) (Temporal)   Ht 5' 1.75" (1.568 m)   Wt 169 lb 7 oz (76.9 kg)   LMP 05/26/2015   SpO2 97%   BMI 31.24 kg/m    CC: CPE Subjective:    Patient ID: Julie Baker, female    DOB: 01-May-1956, 63 y.o.   MRN: [id number]  HPI: Julie Baker is a 63 y.o. female presenting on 07/14/2019 for Annual Exam   Working 7d/wk. No longer walking since her dog died.   Diovan hct brand smaller pill able to swallow, however insurance stopped covering this. Doesn't check BP at home.   Migraine - stable period on topamax 100mg  daily. Has a few migraines per month.   Preventative: Colon screening - always normal stool kits. Requests rpt iFOB.  Mammogram - 08/2018 WNL. Does not do breast exams at home.  Well woman - requests Q533yr pelvic exam. Last pap normal 2019.+fmhx ovarian (mother) and uterine cancer (aunt). Denies vaginal bleeding or pelvic pain/pressure.  LMP 05/2015. Denies vaginal bleeding Flu shot - does not get.  Tetanus 2009. Tdap 2019 Seat belt use discussed Sunscreen use discussed.No changing moles on skin.  Non smoker  Alcohol - none Dentist - Q6 mo Eye exam - yearly  Caffeine: 1 cup coffee/day  Married, lives with husband.  Occupation: [employer]  Activity: some yardwork  Diet:encouraged to avoid salt and sugar in diet     Relevant past medical, surgical, family and social history reviewed and updated as indicated. Interim medical history since our last visit reviewed. Allergies and medications reviewed and updated. Outpatient Medications Prior to Visit  Medication Sig Dispense Refill  . atenolol (TENORMIN) 100 MG tablet TAKE 1 TABLET BY MOUTH EVERY DAY 90 tablet 1  . SUMAtriptan (IMITREX) 100 MG tablet Take one by mouth as directed as needed     . topiramate (TOPAMAX) 100 MG tablet Take 100 mg by mouth at  bedtime.    . valsartan-hydrochlorothiazide (DIOVAN-HCT) 160-12.5 MG tablet Take 1 tablet by mouth daily. 90 tablet 03  . cholecalciferol (VITAMIN D) 1000 UNITS tablet Take 1,000 Units by mouth daily.    . Cholecalciferol (D3-50) 50000 units capsule Take 1 capsule (50,000 Units total) by mouth once a week. (Patient not taking: Reported on 07/14/2019) 12 capsule 1   No facility-administered medications prior to visit.      Per HPI unless specifically indicated in ROS section below Review of Systems  Constitutional: Negative for activity change, appetite change, chills, fatigue, fever and unexpected weight change.  HENT: Negative for hearing loss.   Eyes: Negative for visual disturbance.  Respiratory: Negative for cough, chest tightness, shortness of breath and wheezing.   Cardiovascular: Positive for palpitations (occasional flutter). Negative for chest pain and leg swelling.  Gastrointestinal: Negative for abdominal distention, abdominal pain, blood in stool, constipation, diarrhea, nausea and vomiting.  Genitourinary: Negative for difficulty urinating and hematuria.  Musculoskeletal: Negative for arthralgias, myalgias and neck pain.  Skin: Negative for rash.  Neurological: Positive for headaches (migraines). Negative for dizziness, seizures and syncope.  Hematological: Negative for adenopathy. Does not bruise/bleed easily.  Psychiatric/Behavioral: Negative for dysphoric mood. The patient is not nervous/anxious.    Objective:    BP 140/76 (BP Location: Left Arm, Patient Position: Sitting, Cuff Size: Normal)   Pulse 60   Temp 98.1 F (36.7  C) (Temporal)   Ht 5' 1.75" (1.568 m)   Wt 169 lb 7 oz (76.9 kg)   LMP 05/26/2015   SpO2 97%   BMI 31.24 kg/m   Wt Readings from Last 3 Encounters:  07/14/19 169 lb 7 oz (76.9 kg)  07/07/18 169 lb (76.7 kg)  06/29/17 169 lb 12 oz (77 kg)    Physical Exam Vitals signs and nursing note reviewed.  Constitutional:      General: She is not in  acute distress.    Appearance: Normal appearance. She is well-developed. She is not ill-appearing.  HENT:     Head: Normocephalic and atraumatic.     Right Ear: Hearing, tympanic membrane, ear canal and external ear normal.     Left Ear: Hearing, tympanic membrane, ear canal and external ear normal.     Nose: Nose normal.     Mouth/Throat:     Mouth: Mucous membranes are moist.     Pharynx: Uvula midline. No oropharyngeal exudate or posterior oropharyngeal erythema.  Eyes:     General: No scleral icterus.    Extraocular Movements: Extraocular movements intact.     Conjunctiva/sclera: Conjunctivae normal.     Pupils: Pupils are equal, round, and reactive to light.  Neck:     Musculoskeletal: Normal range of motion and neck supple.  Cardiovascular:     Rate and Rhythm: Normal rate and regular rhythm.     Pulses: Normal pulses.          Radial pulses are 2+ on the right side and 2+ on the left side.     Heart sounds: Murmur (2/6 systolic USB) present.  Pulmonary:     Effort: Pulmonary effort is normal. No respiratory distress.     Breath sounds: Normal breath sounds. No wheezing, rhonchi or rales.  Abdominal:     General: Abdomen is flat. Bowel sounds are normal. There is no distension.     Palpations: Abdomen is soft. There is no mass.     Tenderness: There is no abdominal tenderness. There is no right CVA tenderness, left CVA tenderness, guarding or rebound.     Hernia: No hernia is present.  Musculoskeletal: Normal range of motion.  Lymphadenopathy:     Cervical: No cervical adenopathy.  Skin:    General: Skin is warm and dry.     Findings: No rash.  Neurological:     General: No focal deficit present.     Mental Status: She is alert and oriented to person, place, and time.     Comments: CN grossly intact, station and gait intact  Psychiatric:        Mood and Affect: Mood normal.        Behavior: Behavior normal.        Thought Content: Thought content normal.         Judgment: Judgment normal.       Results for orders placed or performed in visit on 07/11/19  TSH  Result Value Ref Range   TSH 4.50 0.35 - 4.50 uIU/mL  VITAMIN D 25 Hydroxy (Vit-D Deficiency, Fractures)  Result Value Ref Range   VITD 24.47 (L) 30.00 - 100.00 ng/mL  Comprehensive metabolic panel  Result Value Ref Range   Sodium 142 135 - 145 mEq/L   Potassium 3.7 3.5 - 5.1 mEq/L   Chloride 106 96 - 112 mEq/L   CO2 28 19 - 32 mEq/L   Glucose, Bld 98 70 - 99 mg/dL   BUN 14 6 -  23 mg/dL   Creatinine, Ser 0.84 0.40 - 1.20 mg/dL   Total Bilirubin 0.6 0.2 - 1.2 mg/dL   Alkaline Phosphatase 102 39 - 117 U/L   AST 15 0 - 37 U/L   ALT 14 0 - 35 U/L   Total Protein 7.0 6.0 - 8.3 g/dL   Albumin 4.2 3.5 - 5.2 g/dL   Calcium 9.5 8.4 - 10.5 mg/dL   GFR 83.14 >60.00 mL/min  Lipid panel  Result Value Ref Range   Cholesterol 188 0 - 200 mg/dL   Triglycerides 86.0 0.0 - 149.0 mg/dL   HDL 55.20 >39.00 mg/dL   VLDL 17.2 0.0 - 40.0 mg/dL   LDL Cholesterol 116 (H) 0 - 99 mg/dL   Total CHOL/HDL Ratio 3    NonHDL 133.11    Assessment & Plan:   Problem List Items Addressed This Visit    Vitamin D deficiency    Encouraged daily vit D replacement.       SYSTOLIC MURMUR    Stable exam, pt asxs.      Palpitations    Noticing heart fluttering at work over last few weeks. Denies dyspnea, chest pain, dizziness with this. Will monitor and let me know if worsening.       Obesity (BMI 30.0-34.9)    Encouraged healthy diet and lifestyle changes to affect sustainable weight loss.       Migraine without aura    Stable period on current regimen - continue topamax and imitrex.       Relevant Medications   atenolol (TENORMIN) 100 MG tablet   SUMAtriptan (IMITREX) 100 MG tablet   topiramate (TOPAMAX) 100 MG tablet   valsartan-hydrochlorothiazide (DIOVAN-HCT) 160-12.5 MG tablet   Healthcare maintenance - Primary    Preventative protocols reviewed and updated unless pt declined. Discussed  healthy diet and lifestyle.       Essential hypertension    Chronic, stable. Continue current regimen.       Relevant Medications   atenolol (TENORMIN) 100 MG tablet   valsartan-hydrochlorothiazide (DIOVAN-HCT) 160-12.5 MG tablet    Other Visit Diagnoses    Special screening for malignant neoplasms, colon       Relevant Orders   Fecal occult blood, imunochemical       Meds ordered this encounter  Medications  . atenolol (TENORMIN) 100 MG tablet    Sig: Take 1 tablet (100 mg total) by mouth daily.    Dispense:  90 tablet    Refill:  3  . SUMAtriptan (IMITREX) 100 MG tablet    Sig: Take 1 tablet (100 mg total) by mouth once for 1 dose. May repeat in 2 hours x1 if ongoing migraine    Dispense:  10 tablet    Refill:  3  . topiramate (TOPAMAX) 100 MG tablet    Sig: Take 1 tablet (100 mg total) by mouth at bedtime.    Dispense:  90 tablet    Refill:  3  . valsartan-hydrochlorothiazide (DIOVAN-HCT) 160-12.5 MG tablet    Sig: Take 1 tablet by mouth daily.    Dispense:  90 tablet    Refill:  03   Orders Placed This Encounter  Procedures  . Fecal occult blood, imunochemical    Standing Status:   Future    Standing Expiration Date:   07/13/2020    Follow up plan: Return in about 1 year (around 07/13/2020), or if symptoms worsen or fail to improve, for annual exam, prior fasting for blood work.  Ria Bush, MD

## 2019-07-19 ENCOUNTER — Other Ambulatory Visit (INDEPENDENT_AMBULATORY_CARE_PROVIDER_SITE_OTHER): Payer: 59

## 2019-07-19 DIAGNOSIS — Z1211 Encounter for screening for malignant neoplasm of colon: Secondary | ICD-10-CM

## 2019-07-19 LAB — FECAL OCCULT BLOOD, IMMUNOCHEMICAL: Fecal Occult Bld: NEGATIVE

## 2019-07-19 LAB — FECAL OCCULT BLOOD, GUAIAC: Fecal Occult Blood: NEGATIVE

## 2019-07-20 ENCOUNTER — Encounter: Payer: Self-pay | Admitting: Family Medicine

## 2019-08-02 ENCOUNTER — Other Ambulatory Visit: Payer: Self-pay | Admitting: Family Medicine

## 2019-08-26 ENCOUNTER — Telehealth: Payer: Self-pay

## 2019-08-26 ENCOUNTER — Other Ambulatory Visit: Payer: Self-pay

## 2019-08-26 ENCOUNTER — Ambulatory Visit
Admission: RE | Admit: 2019-08-26 | Discharge: 2019-08-26 | Disposition: A | Discharge status: Home / Self Care | Payer: 59 | Source: Ambulatory Visit | Attending: Family Medicine | Admitting: Family Medicine

## 2019-08-26 DIAGNOSIS — Z1231 Encounter for screening mammogram for malignant neoplasm of breast: Secondary | ICD-10-CM

## 2019-08-26 LAB — HM MAMMOGRAPHY

## 2019-08-26 NOTE — Telephone Encounter (Signed)
Left message patient for patient to call back about mammogram results.

## 2019-08-29 ENCOUNTER — Encounter: Payer: Self-pay | Admitting: Family Medicine

## 2019-08-29 NOTE — Telephone Encounter (Signed)
[facility] - Client Nonclinical Telephone Record [facility] - Client Client Site [place] Physician Ria Bush - MD Contact Type Call Who Is Calling Patient / Member / Family / Caregiver Caller Name [place] Phone Number [phone] Call Type Message Only Information Provided Reason for Call Returning a Call from the Office Initial Julie Baker states she is returning a call to the office. Additional Comment Office hours provided. Call Closed By: Dulcy Fanny Transaction Date/Time: 08/27/2019 10:03:27 AM (ET)

## 2019-08-29 NOTE — Telephone Encounter (Signed)
Patient advised.

## 2020-02-20 ENCOUNTER — Ambulatory Visit: Payer: 59 | Attending: Internal Medicine

## 2020-02-20 DIAGNOSIS — Z23 Encounter for immunization: Secondary | ICD-10-CM

## 2020-02-20 NOTE — Progress Notes (Signed)
   Covid-19 Vaccination Clinic  Name:  Julie Baker    MRN: [id number] DOB: 11-09-1956  02/20/2020  Julie Baker was observed post Covid-19 immunization for 15 minutes without incident. She was provided with Vaccine Information Sheet and instruction to access the V-Safe system.   Julie Baker was instructed to call 911 with any severe reactions post vaccine: Marland Kitchen Difficulty breathing  . Swelling of face and throat  . A fast heartbeat  . A bad rash all over body  . Dizziness and weakness   Immunizations Administered    Name Date Dose VIS Date Route   Pfizer COVID-19 Vaccine 02/20/2020  9:22 AM 0.3 mL 11/11/2019 Intramuscular   Manufacturer: [employer], [employer]   Lot: [id number]   NDC: [address]-2

## 2020-03-14 ENCOUNTER — Ambulatory Visit: Payer: 59 | Attending: Internal Medicine

## 2020-03-14 DIAGNOSIS — Z23 Encounter for immunization: Secondary | ICD-10-CM

## 2020-03-14 NOTE — Progress Notes (Signed)
   Covid-19 Vaccination Clinic  Name:  Julie Baker    MRN: [id number] DOB: May 28, 1956  03/14/2020  Julie Baker was observed post Covid-19 immunization for 15 minutes without incident. She was provided with Vaccine Information Sheet and instruction to access the V-Safe system.   Julie Baker was instructed to call 911 with any severe reactions post vaccine: Marland Kitchen Difficulty breathing  . Swelling of face and throat  . A fast heartbeat  . A bad rash all over body  . Dizziness and weakness   Immunizations Administered    Name Date Dose VIS Date Route   Pfizer COVID-19 Vaccine 03/14/2020 10:32 AM 0.3 mL 11/11/2019 Intramuscular   Manufacturer: [employer], [employer]   Lot: [id number]   NDC: [address]-7

## 2020-03-19 ENCOUNTER — Other Ambulatory Visit: Payer: Self-pay | Admitting: Family Medicine

## 2020-03-19 DIAGNOSIS — R002 Palpitations: Secondary | ICD-10-CM

## 2020-03-19 DIAGNOSIS — E559 Vitamin D deficiency, unspecified: Secondary | ICD-10-CM

## 2020-03-19 DIAGNOSIS — I10 Essential (primary) hypertension: Secondary | ICD-10-CM

## 2020-03-20 ENCOUNTER — Other Ambulatory Visit: Payer: 59

## 2020-03-26 ENCOUNTER — Encounter: Payer: 59 | Admitting: Family Medicine

## 2020-07-04 ENCOUNTER — Other Ambulatory Visit: Payer: Self-pay | Admitting: Family Medicine

## 2020-07-19 ENCOUNTER — Other Ambulatory Visit: Payer: Self-pay

## 2020-07-19 ENCOUNTER — Other Ambulatory Visit (INDEPENDENT_AMBULATORY_CARE_PROVIDER_SITE_OTHER): Payer: 59

## 2020-07-19 ENCOUNTER — Other Ambulatory Visit: Payer: 59

## 2020-07-19 DIAGNOSIS — I10 Essential (primary) hypertension: Secondary | ICD-10-CM

## 2020-07-19 DIAGNOSIS — E559 Vitamin D deficiency, unspecified: Secondary | ICD-10-CM

## 2020-07-19 DIAGNOSIS — R002 Palpitations: Secondary | ICD-10-CM

## 2020-07-19 LAB — CBC WITH DIFFERENTIAL/PLATELET
Basophils Absolute: 0.1 10*3/uL (ref 0.0–0.1)
Basophils Relative: 1.2 % (ref 0.0–3.0)
Eosinophils Absolute: 0.2 10*3/uL (ref 0.0–0.7)
Eosinophils Relative: 2.5 % (ref 0.0–5.0)
HCT: 39.5 % (ref 36.0–46.0)
Hemoglobin: 12.8 g/dL (ref 12.0–15.0)
Lymphocytes Relative: 31.3 % (ref 12.0–46.0)
Lymphs Abs: 2 10*3/uL (ref 0.7–4.0)
MCHC: 32.4 g/dL (ref 30.0–36.0)
MCV: 79.2 fl (ref 78.0–100.0)
Monocytes Absolute: 0.5 10*3/uL (ref 0.1–1.0)
Monocytes Relative: 7.4 % (ref 3.0–12.0)
Neutro Abs: 3.7 10*3/uL (ref 1.4–7.7)
Neutrophils Relative %: 57.6 % (ref 43.0–77.0)
Platelets: 263 10*3/uL (ref 150.0–400.0)
RBC: 4.98 Mil/uL (ref 3.87–5.11)
RDW: 14.4 % (ref 11.5–15.5)
WBC: 6.3 10*3/uL (ref 4.0–10.5)

## 2020-07-19 LAB — BASIC METABOLIC PANEL
BUN: 14 mg/dL (ref 6–23)
CO2: 26 mEq/L (ref 19–32)
Calcium: 9.4 mg/dL (ref 8.4–10.5)
Chloride: 108 mEq/L (ref 96–112)
Creatinine, Ser: 0.9 mg/dL (ref 0.40–1.20)
GFR: 76.52 mL/min (ref >60.00)
Glucose, Bld: 104 mg/dL — ABNORMAL HIGH (ref 70–99)
Potassium: 3.9 mEq/L (ref 3.5–5.1)
Sodium: 141 mEq/L (ref 135–145)

## 2020-07-19 LAB — TSH: TSH: 3.49 u[IU]/mL (ref 0.35–4.50)

## 2020-07-19 LAB — VITAMIN D 25 HYDROXY (VIT D DEFICIENCY, FRACTURES): VITD: 19.65 ng/mL — ABNORMAL LOW (ref 30.00–100.00)

## 2020-07-26 ENCOUNTER — Encounter: Payer: 59 | Admitting: Family Medicine

## 2020-08-01 ENCOUNTER — Other Ambulatory Visit (HOSPITAL_COMMUNITY)
Admission: RE | Admit: 2020-08-01 | Discharge: 2020-08-01 | Disposition: A | Discharge status: Home / Self Care | Payer: 59 | Source: Ambulatory Visit | Attending: Family Medicine | Admitting: Family Medicine

## 2020-08-01 ENCOUNTER — Other Ambulatory Visit: Payer: Self-pay

## 2020-08-01 ENCOUNTER — Ambulatory Visit (INDEPENDENT_AMBULATORY_CARE_PROVIDER_SITE_OTHER): Payer: 59 | Admitting: Family Medicine

## 2020-08-01 ENCOUNTER — Encounter: Payer: Self-pay | Admitting: Family Medicine

## 2020-08-01 VITALS — BP 132/84 | HR 89 | Temp 97.5°F | Ht 62.91 in | Wt 176.9 lb

## 2020-08-01 DIAGNOSIS — I10 Essential (primary) hypertension: Secondary | ICD-10-CM

## 2020-08-01 DIAGNOSIS — E66811 Obesity, class 1: Secondary | ICD-10-CM

## 2020-08-01 DIAGNOSIS — G43009 Migraine without aura, not intractable, without status migrainosus: Secondary | ICD-10-CM | POA: Diagnosis not present

## 2020-08-01 DIAGNOSIS — E559 Vitamin D deficiency, unspecified: Secondary | ICD-10-CM

## 2020-08-01 DIAGNOSIS — Z01419 Encounter for gynecological examination (general) (routine) without abnormal findings: Secondary | ICD-10-CM | POA: Insufficient documentation

## 2020-08-01 DIAGNOSIS — Z1211 Encounter for screening for malignant neoplasm of colon: Secondary | ICD-10-CM | POA: Diagnosis not present

## 2020-08-01 DIAGNOSIS — Z0000 Encounter for general adult medical examination without abnormal findings: Secondary | ICD-10-CM

## 2020-08-01 DIAGNOSIS — E669 Obesity, unspecified: Secondary | ICD-10-CM

## 2020-08-01 DIAGNOSIS — R011 Cardiac murmur, unspecified: Secondary | ICD-10-CM

## 2020-08-01 MED ORDER — VALSARTAN-HYDROCHLOROTHIAZIDE 160-12.5 MG PO TABS: 1.0000 | ORAL_TABLET | Freq: Every day | ORAL | 3 refills | Status: DC

## 2020-08-01 MED ORDER — D3-50 1.25 MG (50000 UT) PO CAPS: ORAL_CAPSULE | ORAL | 3 refills | Status: DC

## 2020-08-01 MED ORDER — ATENOLOL 100 MG PO TABS: 100.0000 mg | ORAL_TABLET | Freq: Every day | ORAL | 3 refills | Status: DC

## 2020-08-01 NOTE — Assessment & Plan Note (Signed)
Chronic, stable. Continue current regimen. 

## 2020-08-01 NOTE — Patient Instructions (Addendum)
Pass by the lab to pick up stool kit. Restart vitamin D 50, 000 units weekly.  Work on regular exercise routine - try to get 150 minutes of aerobic exercise weekly.   Return as needed or in 1 year for next physical.  Health Maintenance for Postmenopausal Women Menopause is a normal process in which your ability to get pregnant comes to an end. This process happens slowly over many months or years, usually between the ages of 63 and 5. Menopause is complete when you have missed your menstrual periods for 12 months. It is important to talk with your health care provider about some of the most common conditions that affect women after menopause (postmenopausal women). These include heart disease, cancer, and bone loss (osteoporosis). Adopting a healthy lifestyle and getting preventive care can help to promote your health and wellness. The actions you take can also lower your chances of developing some of these common conditions. What should I know about menopause? During menopause, you may get a number of symptoms, such as:  Hot flashes. These can be moderate or severe.  Night sweats.  Decrease in sex drive.  Mood swings.  Headaches.  Tiredness.  Irritability.  Memory problems.  Insomnia. Choosing to treat or not to treat these symptoms is a decision that you make with your health care provider. Do I need hormone replacement therapy?  Hormone replacement therapy is effective in treating symptoms that are caused by menopause, such as hot flashes and night sweats.  Hormone replacement carries certain risks, especially as you become older. If you are thinking about using estrogen or estrogen with progestin, discuss the benefits and risks with your health care provider. What is my risk for heart disease and stroke? The risk of heart disease, heart attack, and stroke increases as you age. One of the causes may be a change in the body's hormones during menopause. This can affect how your  body uses dietary fats, triglycerides, and cholesterol. Heart attack and stroke are medical emergencies. There are many things that you can do to help prevent heart disease and stroke. Watch your blood pressure  High blood pressure causes heart disease and increases the risk of stroke. This is more likely to develop in people who have high blood pressure readings, are of African descent, or are overweight.  Have your blood pressure checked: ? Every 3-5 years if you are 63-75 years of age. ? Every year if you are 80 years old or older. Eat a healthy diet   Eat a diet that includes plenty of vegetables, fruits, low-fat dairy products, and lean protein.  Do not eat a lot of foods that are high in solid fats, added sugars, or sodium. Get regular exercise Get regular exercise. This is one of the most important things you can do for your health. Most adults should:  Try to exercise for at least 150 minutes each week. The exercise should increase your heart rate and make you sweat (moderate-intensity exercise).  Try to do strengthening exercises at least twice each week. Do these in addition to the moderate-intensity exercise.  Spend less time sitting. Even light physical activity can be beneficial. Other tips  Work with your health care provider to achieve or maintain a healthy weight.  Do not use any products that contain nicotine or tobacco, such as cigarettes, e-cigarettes, and chewing tobacco. If you need help quitting, ask your health care provider.  Know your numbers. Ask your health care provider to check your cholesterol and  your blood sugar (glucose). Continue to have your blood tested as directed by your health care provider. Do I need screening for cancer? Depending on your health history and family history, you may need to have cancer screening at different stages of your life. This may include screening for:  Breast cancer.  Cervical cancer.  Lung cancer.  Colorectal  cancer. What is my risk for osteoporosis? After menopause, you may be at increased risk for osteoporosis. Osteoporosis is a condition in which bone destruction happens more quickly than new bone creation. To help prevent osteoporosis or the bone fractures that can happen because of osteoporosis, you may take the following actions:  If you are 81-109 years old, get at least 1,000 mg of calcium and at least 600 mg of vitamin D per day.  If you are older than age 19 but younger than age 68, get at least 1,200 mg of calcium and at least 600 mg of vitamin D per day.  If you are older than age 37, get at least 1,200 mg of calcium and at least 800 mg of vitamin D per day. Smoking and drinking excessive alcohol increase the risk of osteoporosis. Eat foods that are rich in calcium and vitamin D, and do weight-bearing exercises several times each week as directed by your health care provider. How does menopause affect my mental health? Depression may occur at any age, but it is more common as you become older. Common symptoms of depression include:  Low or sad mood.  Changes in sleep patterns.  Changes in appetite or eating patterns.  Feeling an overall lack of motivation or enjoyment of activities that you previously enjoyed.  Frequent crying spells. Talk with your health care provider if you think that you are experiencing depression. General instructions See your health care provider for regular wellness exams and vaccines. This may include:  Scheduling regular health, dental, and eye exams.  Getting and maintaining your vaccines. These include: ? Influenza vaccine. Get this vaccine each year before the flu season begins. ? Pneumonia vaccine. ? Shingles vaccine. ? Tetanus, diphtheria, and pertussis (Tdap) booster vaccine. Your health care provider may also recommend other immunizations. Tell your health care provider if you have ever been abused or do not feel safe at  home. Summary  Menopause is a normal process in which your ability to get pregnant comes to an end.  This condition causes hot flashes, night sweats, decreased interest in sex, mood swings, headaches, or lack of sleep.  Treatment for this condition may include hormone replacement therapy.  Take actions to keep yourself healthy, including exercising regularly, eating a healthy diet, watching your weight, and checking your blood pressure and blood sugar levels.  Get screened for cancer and depression. Make sure that you are up to date with all your vaccines. This information is not intended to replace advice given to you by your health care provider. Make sure you discuss any questions you have with your health care provider. Document Revised: 11/10/2018 Document Reviewed: 11/10/2018 Elsevier Patient Education  2020 [employer].

## 2020-08-01 NOTE — Progress Notes (Signed)
This visit was conducted in person.  BP 132/84   Pulse 89   Temp (!) 97.5 F (36.4 C) (Oral)   Ht 5' 2.91" (1.598 m)   Wt 176 lb 14.4 oz (80.2 kg)   LMP 05/26/2015   SpO2 98%   BMI 31.42 kg/m    CC: CPE Subjective:    Patient ID: Julie Baker, female    DOB: 1956/07/16, 64 y.o.   MRN: [id number]  HPI: Julie Baker is a 64 y.o. female presenting on 08/01/2020 for Annual Exam   Migraine - stable period on topamax 13m daily with imitrex abortively. Has a few migraines per month. [facility]  Preventative: Colon screening - always normal stool kits. Requests rpt iFOB.  Mammogram - 08/2019 WNL. Does not do breast exams at home.  Well woman - requests Q237yrelvic exam. Last pap normal 2019.+fmhx ovarian (mother) and uterine cancer (aunt). Denies vaginal bleeding or pelvic pain/pressure.  LMP 05/2015  Flu shot - does not get.  Tetanus 2009.Tdap 2019 COVID vaccine [place]/2021, 03/2020.  Shingrix - discussed, declines Seat belt use discussed Sunscreen use discussed.No changing moles on skin.  Non smoker  Alcohol - none Dentist - Q6 mo Eye exam - yearly Bowel - no constipation Bladder - no incontinence  Caffeine: 1 cup coffee/day  Married, lives with husband.  Occupation: [employer] - retired last month  Activity: some yardwork  Diet:encouraged to avoid salt and sugar in diet     Relevant past medical, surgical, family and social history reviewed and updated as indicated. Interim medical history since our last visit reviewed. Allergies and medications reviewed and updated. Outpatient Medications Prior to Visit  Medication Sig Dispense Refill  . topiramate (TOPAMAX) 100 MG tablet Take 1 tablet (100 mg total) by mouth at bedtime.    . Marland Kitchentenolol (TENORMIN) 100 MG tablet Take 1 tablet (100 mg total) by mouth daily. 90 tablet 3  . cholecalciferol (VITAMIN D) 1000 UNITS tablet Take 1,000 Units by mouth daily.    . D3-50 1.25 MG (50000  UT) capsule TAKE ONE CAPSULE BY MOUTH ONE TIME PER WEEK 12 capsule 1  . topiramate (TOPAMAX) 100 MG tablet Take 1 tablet (100 mg total) by mouth at bedtime. 90 tablet 3  . valsartan-hydrochlorothiazide (DIOVAN-HCT) 160-12.5 MG tablet TAKE 1 TABLET BY MOUTH EVERY DAY 90 tablet 0  . SUMAtriptan (IMITREX) 100 MG tablet Take 1 tablet (100 mg total) by mouth once for 1 dose. May repeat in 2 hours x1 if ongoing migraine    . SUMAtriptan (IMITREX) 100 MG tablet Take 1 tablet (100 mg total) by mouth once for 1 dose. May repeat in 2 hours x1 if ongoing migraine 10 tablet 3   No facility-administered medications prior to visit.     Per HPI unless specifically indicated in ROS section below Review of Systems  Constitutional: Negative for activity change, appetite change, chills, fatigue, fever and unexpected weight change.  HENT: Negative for hearing loss.   Eyes: Negative for visual disturbance.  Respiratory: Negative for cough, chest tightness, shortness of breath and wheezing.   Cardiovascular: Negative for chest pain, palpitations and leg swelling.  Gastrointestinal: Negative for abdominal distention, abdominal pain, blood in stool, constipation, diarrhea, nausea and vomiting.  Genitourinary: Negative for difficulty urinating and hematuria.  Musculoskeletal: Negative for arthralgias, myalgias and neck pain.  Skin: Negative for rash.  Neurological: Positive for headaches (migraine). Negative for dizziness, seizures and syncope.  Hematological: Negative for adenopathy.  Does not bruise/bleed easily.  Psychiatric/Behavioral: Negative for dysphoric mood. The patient is not nervous/anxious.    Objective:  BP 132/84   Pulse 89   Temp (!) 97.5 F (36.4 C) (Oral)   Ht 5' 2.91" (1.598 m)   Wt 176 lb 14.4 oz (80.2 kg)   LMP 05/26/2015   SpO2 98%   BMI 31.42 kg/m   Wt Readings from Last 3 Encounters:  08/01/20 176 lb 14.4 oz (80.2 kg)  07/14/19 169 lb 7 oz (76.9 kg)  07/07/18 169 lb (76.7 kg)        Physical Exam Vitals and nursing note reviewed. Exam conducted with a chaperone present.  Constitutional:      General: She is not in acute distress.    Appearance: Normal appearance. She is well-developed. She is not ill-appearing.  HENT:     Head: Normocephalic and atraumatic.     Right Ear: Hearing, tympanic membrane, ear canal and external ear normal.     Left Ear: Hearing, tympanic membrane, ear canal and external ear normal.  Eyes:     General: No scleral icterus.    Extraocular Movements: Extraocular movements intact.     Conjunctiva/sclera: Conjunctivae normal.     Pupils: Pupils are equal, round, and reactive to light.  Neck:     Thyroid: No thyroid mass, thyromegaly or thyroid tenderness.  Cardiovascular:     Rate and Rhythm: Normal rate and regular rhythm.     Pulses: Normal pulses.          Radial pulses are 2+ on the right side and 2+ on the left side.     Heart sounds: Murmur (2/6 systolic) heard.   Pulmonary:     Effort: Pulmonary effort is normal. No respiratory distress.     Breath sounds: Normal breath sounds. No wheezing, rhonchi or rales.  Abdominal:     General: Abdomen is flat. Bowel sounds are normal. There is no distension.     Palpations: Abdomen is soft. There is no mass.     Tenderness: There is no abdominal tenderness. There is no guarding or rebound.     Hernia: No hernia is present.  Genitourinary:    General: Normal vulva.     Exam position: Supine.     Labia:        Right: No rash, tenderness or lesion.        Left: No rash, tenderness or lesion.      Vagina: Normal.     Cervix: Normal.     Uterus: Normal.      Adnexa: Right adnexa normal and left adnexa normal.     Comments: Pap performed on cervix  Musculoskeletal:        General: Normal range of motion.     Cervical back: Normal range of motion and neck supple.     Right lower leg: No edema.     Left lower leg: No edema.  Lymphadenopathy:     Cervical: No cervical adenopathy.   Skin:    General: Skin is warm and dry.     Findings: No rash.  Neurological:     General: No focal deficit present.     Mental Status: She is alert and oriented to person, place, and time.     Comments: CN grossly intact, station and gait intact  Psychiatric:        Mood and Affect: Mood normal.        Behavior: Behavior normal.  Thought Content: Thought content normal.        Judgment: Judgment normal.       Results for orders placed or performed in visit on 07/19/20  VITAMIN D 25 Hydroxy (Vit-D Deficiency, Fractures)  Result Value Ref Range   VITD 19.65 (L) 30.00 - 100.00 ng/mL  CBC with Differential/Platelet  Result Value Ref Range   WBC 6.3 4.0 - 10.5 K/uL   RBC 4.98 3.87 - 5.11 Mil/uL   Hemoglobin 12.8 12.0 - 15.0 g/dL   HCT 39.5 36 - 46 %   MCV 79.2 78.0 - 100.0 fl   MCHC 32.4 30.0 - 36.0 g/dL   RDW 14.4 11.5 - 15.5 %   Platelets 263.0 150 - 400 K/uL   Neutrophils Relative % 57.6 43 - 77 %   Lymphocytes Relative 31.3 12 - 46 %   Monocytes Relative 7.4 3 - 12 %   Eosinophils Relative 2.5 0 - 5 %   Basophils Relative 1.2 0 - 3 %   Neutro Abs 3.7 1.4 - 7.7 K/uL   Lymphs Abs 2.0 0.7 - 4.0 K/uL   Monocytes Absolute 0.5 0 - 1 K/uL   Eosinophils Absolute 0.2 0 - 0 K/uL   Basophils Absolute 0.1 0 - 0 K/uL  TSH  Result Value Ref Range   TSH 3.49 0.35 - 4.50 uIU/mL  Basic metabolic panel  Result Value Ref Range   Sodium 141 135 - 145 mEq/L   Potassium 3.9 3.5 - 5.1 mEq/L   Chloride 108 96 - 112 mEq/L   CO2 26 19 - 32 mEq/L   Glucose, Bld 104 (H) 70 - 99 mg/dL   BUN 14 6 - 23 mg/dL   Creatinine, Ser 0.90 0.40 - 1.20 mg/dL   GFR 76.52 >60.00 mL/min   Calcium 9.4 8.4 - 10.5 mg/dL   Assessment & Plan:  This visit occurred during the SARS-CoV-2 public health emergency.  Safety protocols were in place, including screening questions prior to the visit, additional usage of staff PPE, and extensive cleaning of exam room while observing appropriate contact time as  indicated for disinfecting solutions.   Problem List Items Addressed This Visit    Vitamin D deficiency    Restart vit D 50,000 IU weekly.       SYSTOLIC MURMUR    Mild, continue to monitor.       Obesity (BMI 30.0-34.9)    Encouraged healthy diet and lifestyle changes to affect sustainable weight loss.       Migraine without aura    [facility]       Relevant Medications   atenolol (TENORMIN) 100 MG tablet   valsartan-hydrochlorothiazide (DIOVAN-HCT) 160-12.5 MG tablet   topiramate (TOPAMAX) 100 MG tablet   SUMAtriptan (IMITREX) 100 MG tablet   Healthcare maintenance - Primary    Preventative protocols reviewed and updated unless pt declined. Discussed healthy diet and lifestyle.       Essential hypertension    Chronic, stable. Continue current regimen.       Relevant Medications   atenolol (TENORMIN) 100 MG tablet   valsartan-hydrochlorothiazide (DIOVAN-HCT) 160-12.5 MG tablet    Other Visit Diagnoses    Special screening for malignant neoplasms, colon       Relevant Orders   Fecal occult blood, imunochemical       Meds ordered this encounter  Medications  . Cholecalciferol (D3-50) 1.25 MG (50000 UT) capsule    Sig: TAKE ONE CAPSULE BY MOUTH ONE TIME PER WEEK  Dispense:  12 capsule    Refill:  3  . atenolol (TENORMIN) 100 MG tablet    Sig: Take 1 tablet (100 mg total) by mouth daily.    Dispense:  90 tablet    Refill:  3  . valsartan-hydrochlorothiazide (DIOVAN-HCT) 160-12.5 MG tablet    Sig: Take 1 tablet by mouth daily.    Dispense:  90 tablet    Refill:  3   Orders Placed This Encounter  Procedures  . Fecal occult blood, imunochemical    Standing Status:   Future    Standing Expiration Date:   08/01/2021    Patient instructions: Pass by the lab to pick up stool kit. Restart vitamin D 50, 000 units weekly.  Work on regular exercise routine - try to get 150 minutes of aerobic exercise weekly.   Return as needed or in 1 year for next  physical.  Follow up plan: Return in about 1 year (around 08/01/2021) for annual exam, prior fasting for blood work.  Ria Bush, MD

## 2020-08-01 NOTE — Assessment & Plan Note (Signed)
Encouraged healthy diet and lifestyle changes to affect sustainable weight loss.  

## 2020-08-01 NOTE — Addendum Note (Signed)
Addended by: Nanci Pina on: 08/01/2020 02:52 PM   Modules accepted: Orders

## 2020-08-01 NOTE — Assessment & Plan Note (Signed)
Preventative protocols reviewed and updated unless pt declined. Discussed healthy diet and lifestyle.  

## 2020-08-01 NOTE — Assessment & Plan Note (Signed)
[facility]

## 2020-08-01 NOTE — Assessment & Plan Note (Addendum)
Mild, continue to monitor.  

## 2020-08-01 NOTE — Assessment & Plan Note (Signed)
Restart vit D 50,000 IU weekly.

## 2020-08-02 ENCOUNTER — Other Ambulatory Visit: Payer: Self-pay | Admitting: Family Medicine

## 2020-08-02 DIAGNOSIS — Z1231 Encounter for screening mammogram for malignant neoplasm of breast: Secondary | ICD-10-CM

## 2020-08-07 LAB — CYTOLOGY - PAP
Comment: NEGATIVE
Diagnosis: NEGATIVE
High risk HPV: NEGATIVE

## 2020-08-08 ENCOUNTER — Other Ambulatory Visit (INDEPENDENT_AMBULATORY_CARE_PROVIDER_SITE_OTHER): Payer: 59

## 2020-08-08 DIAGNOSIS — Z1211 Encounter for screening for malignant neoplasm of colon: Secondary | ICD-10-CM | POA: Diagnosis not present

## 2020-08-08 LAB — FECAL OCCULT BLOOD, GUAIAC: Fecal Occult Blood: NEGATIVE

## 2020-08-08 LAB — FECAL OCCULT BLOOD, IMMUNOCHEMICAL: Fecal Occult Bld: NEGATIVE

## 2020-08-09 ENCOUNTER — Encounter: Payer: Self-pay | Admitting: Family Medicine

## 2020-08-27 ENCOUNTER — Other Ambulatory Visit: Payer: Self-pay

## 2020-08-27 ENCOUNTER — Ambulatory Visit
Admission: RE | Admit: 2020-08-27 | Discharge: 2020-08-27 | Disposition: A | Discharge status: Home / Self Care | Payer: 59 | Source: Ambulatory Visit | Attending: Family Medicine | Admitting: Family Medicine

## 2020-08-27 DIAGNOSIS — Z1231 Encounter for screening mammogram for malignant neoplasm of breast: Secondary | ICD-10-CM

## 2020-08-28 ENCOUNTER — Encounter: Payer: Self-pay | Admitting: Family Medicine

## 2020-08-28 LAB — HM MAMMOGRAPHY

## 2021-07-03 ENCOUNTER — Other Ambulatory Visit: Payer: Self-pay | Admitting: Family Medicine

## 2021-07-19 ENCOUNTER — Other Ambulatory Visit: Payer: Self-pay | Admitting: Family Medicine

## 2021-07-19 DIAGNOSIS — Z1231 Encounter for screening mammogram for malignant neoplasm of breast: Secondary | ICD-10-CM

## 2021-07-29 ENCOUNTER — Other Ambulatory Visit: Payer: Self-pay | Admitting: Family Medicine

## 2021-09-03 ENCOUNTER — Other Ambulatory Visit: Payer: Self-pay

## 2021-09-03 ENCOUNTER — Ambulatory Visit
Admission: RE | Admit: 2021-09-03 | Discharge: 2021-09-03 | Disposition: A | Discharge status: Home / Self Care | Payer: 59 | Source: Ambulatory Visit | Attending: Family Medicine | Admitting: Family Medicine

## 2021-09-03 ENCOUNTER — Other Ambulatory Visit: Payer: Self-pay | Admitting: Family Medicine

## 2021-09-03 DIAGNOSIS — Z1231 Encounter for screening mammogram for malignant neoplasm of breast: Secondary | ICD-10-CM

## 2021-09-06 ENCOUNTER — Other Ambulatory Visit: Payer: 59

## 2021-09-06 ENCOUNTER — Other Ambulatory Visit: Payer: Self-pay | Admitting: Family Medicine

## 2021-09-06 DIAGNOSIS — I10 Essential (primary) hypertension: Secondary | ICD-10-CM

## 2021-09-06 DIAGNOSIS — E559 Vitamin D deficiency, unspecified: Secondary | ICD-10-CM

## 2021-09-13 ENCOUNTER — Encounter: Payer: Self-pay | Admitting: Family Medicine

## 2021-09-13 ENCOUNTER — Other Ambulatory Visit: Payer: Self-pay

## 2021-09-13 ENCOUNTER — Ambulatory Visit (INDEPENDENT_AMBULATORY_CARE_PROVIDER_SITE_OTHER): Payer: 59 | Admitting: Family Medicine

## 2021-09-13 VITALS — BP 160/84 | HR 75 | Temp 97.6°F | Ht 62.0 in | Wt 172.2 lb

## 2021-09-13 DIAGNOSIS — Z0000 Encounter for general adult medical examination without abnormal findings: Secondary | ICD-10-CM | POA: Diagnosis not present

## 2021-09-13 DIAGNOSIS — E669 Obesity, unspecified: Secondary | ICD-10-CM

## 2021-09-13 DIAGNOSIS — I10 Essential (primary) hypertension: Secondary | ICD-10-CM | POA: Diagnosis not present

## 2021-09-13 DIAGNOSIS — E559 Vitamin D deficiency, unspecified: Secondary | ICD-10-CM

## 2021-09-13 DIAGNOSIS — G43009 Migraine without aura, not intractable, without status migrainosus: Secondary | ICD-10-CM

## 2021-09-13 DIAGNOSIS — Z1211 Encounter for screening for malignant neoplasm of colon: Secondary | ICD-10-CM

## 2021-09-13 DIAGNOSIS — Z7189 Other specified counseling: Secondary | ICD-10-CM

## 2021-09-13 DIAGNOSIS — E66811 Obesity, class 1: Secondary | ICD-10-CM

## 2021-09-13 DIAGNOSIS — R002 Palpitations: Secondary | ICD-10-CM

## 2021-09-13 MED ORDER — ATENOLOL 100 MG PO TABS: 100.0000 mg | ORAL_TABLET | Freq: Every day | ORAL | 3 refills | Status: DC

## 2021-09-13 MED ORDER — VITAMIN D3 1.25 MG (50000 UT) PO CAPS: ORAL_CAPSULE | ORAL | 3 refills | Status: DC

## 2021-09-13 MED ORDER — VALSARTAN-HYDROCHLOROTHIAZIDE 160-12.5 MG PO TABS: 1.0000 | ORAL_TABLET | Freq: Every day | ORAL | 3 refills | Status: DC

## 2021-09-13 NOTE — Addendum Note (Signed)
Addended by: Shon Millet on: 09/13/2021 10:45 AM   Modules accepted: Orders

## 2021-09-13 NOTE — Assessment & Plan Note (Signed)
Sees [facility] on topamax and imitrex prn

## 2021-09-13 NOTE — Progress Notes (Signed)
Patient ID: Julie Baker, female    DOB: Jan 26, 1956, 65 y.o.   MRN: [id number]  This visit was conducted in person.  BP (!) 160/84   Pulse 75   Temp 97.6 F (36.4 C) (Temporal)   Ht 5\' 2"  (1.575 m)   Wt 172 lb 3 oz (78.1 kg)   LMP 05/26/2015   SpO2 98%   BMI 31.49 kg/m    CC: CPE Subjective:   HPI: Julie Baker is a 65 y.o. female presenting on 09/13/2021 for Annual Exam   She's upset she had to drive to [place] this year - would have rescheduled if she'd known earlier.   Migraine - stable period on topamax 100mg  daily with imitrex abortively. Has a few migraines per month. [facility]  HTN - BP elevated today despite atenolol 100mg  daily and valsartan hctz 160/12.5mg  daily. Attributes to drive here.   Preventative: Colon screening - always normal stool kits. Requests rpt iFOB.  [facility]. Does not do breast exams at home.  Well woman - pelvic exam Q2 yrs given fmhx ovarian cancer (mother and uterine (aunt). Last pap smear normal 08/2020. Denies vaginal bleeding or pelvic pain/pressure, abd bloating/gassiness.  LMP 05/2015  DEXA scan - due next year.  Lung cancer screening - not eligible  Flu shot - does not get.  COVID vaccine - Pfizer 01/2020, 03/2020, thinks had booster x1 - will check records.  Tetanus 2009. Tdap 2019.  Shingrix - discussed, to consider  Advanced directive - discussed. Doesn't have advanced directive set up. Packet provided today. Husband would be HCPOA.  Seat belt use discussed Sunscreen use discussed. No changing moles on skin.  Non smoker  Alcohol - none Dentist - Q6 mo Eye exam - yearly Bowel - no constipation Bladder - no incontinence  Caffeine: 1 cup coffee/day   Married, lives with husband.   Occupation: [employer] - retired last month  Activity: walks 1 hour every day  Diet: encouraged to avoid salt and sugar in diet     Relevant past medical, surgical, family and social  history reviewed and updated as indicated. Interim medical history since our last visit reviewed. Allergies and medications reviewed and updated. Outpatient Medications Prior to Visit  Medication Sig Dispense Refill   SUMAtriptan (IMITREX) 100 MG tablet Take 1 tablet (100 mg total) by mouth once for 1 dose. May repeat in 2 hours x1 if ongoing migraine     topiramate (TOPAMAX) 100 MG tablet Take 1 tablet (100 mg total) by mouth at bedtime.     atenolol (TENORMIN) 100 MG tablet TAKE 1 TABLET BY MOUTH EVERY DAY 90 tablet 0   Cholecalciferol (VITAMIN D3) 1.25 MG (50000 UT) CAPS TAKE ONE CAPSULE BY MOUTH ONE TIME PER WEEK 12 capsule 3   valsartan-hydrochlorothiazide (DIOVAN-HCT) 160-12.5 MG tablet TAKE 1 TABLET BY MOUTH EVERY DAY 90 tablet 0   No facility-administered medications prior to visit.     Per HPI unless specifically indicated in ROS section below Review of Systems  Constitutional:  Negative for activity change, appetite change, chills, fatigue, fever and unexpected weight change.  HENT:  Negative for hearing loss.   Eyes:  Negative for visual disturbance.  Respiratory:  Negative for cough, chest tightness, shortness of breath and wheezing.   Cardiovascular:  Positive for palpitations (occasional). Negative for chest pain and leg swelling.  Gastrointestinal:  Negative for abdominal distention, abdominal pain, blood in stool, constipation, diarrhea, nausea  and vomiting.  Genitourinary:  Negative for difficulty urinating and hematuria.  Musculoskeletal:  Negative for arthralgias, myalgias and neck pain.  Skin:  Negative for rash.  Neurological:  Positive for headaches (migraines). Negative for dizziness, seizures and syncope.  Hematological:  Negative for adenopathy. Does not bruise/bleed easily.  Psychiatric/Behavioral:  Negative for dysphoric mood. The patient is not nervous/anxious.    Objective:  BP (!) 160/84   Pulse 75   Temp 97.6 F (36.4 C) (Temporal)   Ht 5\' 2"  (1.575 m)    Wt 172 lb 3 oz (78.1 kg)   LMP 05/26/2015   SpO2 98%   BMI 31.49 kg/m   Wt Readings from Last 3 Encounters:  09/13/21 172 lb 3 oz (78.1 kg)  08/01/20 176 lb 14.4 oz (80.2 kg)  07/14/19 169 lb 7 oz (76.9 kg)      Physical Exam    Results for orders placed or performed in visit on 08/28/20  [facility] MAMMOGRAPHY  Result Value Ref Range   [facility] Mammogram 0-4 Bi-Rad 0-4 Bi-Rad, Self Reported Normal    Assessment & Plan:  This visit occurred during the SARS-CoV-2 public health emergency.  Safety protocols were in place, including screening questions prior to the visit, additional usage of staff PPE, and extensive cleaning of exam room while observing appropriate contact time as indicated for disinfecting solutions.   Problem List Items Addressed This Visit     Healthcare maintenance - Primary (Chronic)    Preventative protocols reviewed and updated unless pt declined. Discussed healthy diet and lifestyle.       Advanced directives, counseling/discussion (Chronic)    Advanced directive - discussed. Doesn't have advanced directive set up. Packet provided today. Husband would be HCPOA.       Migraine without aura    [facility] on topamax and imitrex prn      Relevant Medications   atenolol (TENORMIN) 100 MG tablet   valsartan-hydrochlorothiazide (DIOVAN-HCT) 160-12.5 MG tablet   Essential hypertension    Chronic, BP elevated today Attributed to drive here today as well as increased salty meal yesterday.  Advised start monitoring blood pressure at home, let 08/30/20 know if consistently elevated to increase BP regimen       Relevant Medications   atenolol (TENORMIN) 100 MG tablet   valsartan-hydrochlorothiazide (DIOVAN-HCT) 160-12.5 MG tablet   Vitamin D deficiency    Continue vit D 50k units weekly. Update levels. Not recently taking replacement.       Obesity (BMI 30.0-34.9)    Encouraged healthy diet and lifestyle changes to affect sustainable weight loss.        Palpitations    Showed how to check heart rate next time she feels palpitations.       Relevant Orders   TSH   Other Visit Diagnoses     Special screening for malignant neoplasms, colon       Relevant Orders   Fecal occult blood, imunochemical        Meds ordered this encounter  Medications   atenolol (TENORMIN) 100 MG tablet    Sig: Take 1 tablet (100 mg total) by mouth daily.    Dispense:  90 tablet    Refill:  3   Cholecalciferol (VITAMIN D3) 1.25 MG (50000 UT) CAPS    Sig: TAKE ONE CAPSULE BY MOUTH ONE TIME PER WEEK    Dispense:  12 capsule    Refill:  3   valsartan-hydrochlorothiazide (DIOVAN-HCT) 160-12.5 MG tablet    Sig: Take  1 tablet by mouth daily.    Dispense:  90 tablet    Refill:  3    Orders Placed This Encounter  Procedures   Fecal occult blood, imunochemical    Standing Status:   Future    Standing Expiration Date:   09/13/2022   TSH     Patient instructions: Pass by lab for blood work and stool test.  Blood pressures were elevated today - start monitoring more regularly at home and call me with readings in 2-3 weeks. If staying elevated we may need to increase BP medicine dose. In interim, also work on low sodium/salt diet, look into DASH diet, good water intake, regular walking routine.  Look into shingles shot - to help prevent shingles. If interested, call us for nurse visit.  Return as needed or in 1 year for welcome to medicare visit.   Follow up plan: Return in about 1 year (around 09/13/2022) for annual exam, prior fasting for blood work, medicare wellness visit.  Eustaquio Boyden, MD

## 2021-09-13 NOTE — Assessment & Plan Note (Signed)
Chronic, BP elevated today Attributed to drive here today as well as increased salty meal yesterday.  Advised start monitoring blood pressure at home, let us know if consistently elevated to increase BP regimen

## 2021-09-13 NOTE — Assessment & Plan Note (Signed)
Preventative protocols reviewed and updated unless pt declined. Discussed healthy diet and lifestyle.  

## 2021-09-13 NOTE — Patient Instructions (Addendum)
Pass by lab for blood work and stool test.  Blood pressures were elevated today - start monitoring more regularly at home and call me with readings in 2-3 weeks. If staying elevated we may need to increase BP medicine dose. In interim, also work on low sodium/salt diet, look into DASH diet, good water intake, regular walking routine.  Look into shingles shot - to help prevent shingles. If interested, call us for nurse visit.  Return as needed or in 1 year for welcome to medicare visit.   DASH Eating Plan DASH stands for Dietary Approaches to Stop Hypertension. The DASH eating plan is a healthy eating plan that has been shown to: Reduce high blood pressure (hypertension). Reduce your risk for type 2 diabetes, heart disease, and stroke. Help with weight loss. What are tips for following this plan? Reading food labels Check food labels for the amount of salt (sodium) per serving. Choose foods with less than 5 percent of the Daily Value of sodium. Generally, foods with less than 300 milligrams (mg) of sodium per serving fit into this eating plan. To find whole grains, look for the word "whole" as the first word in the ingredient list. Shopping Buy products labeled as "low-sodium" or "no salt added." Buy fresh foods. Avoid canned foods and pre-made or frozen meals. Cooking Avoid adding salt when cooking. Use salt-free seasonings or herbs instead of table salt or sea salt. Check with your health care provider or pharmacist before using salt substitutes. Do not fry foods. Cook foods using healthy methods such as baking, boiling, grilling, roasting, and broiling instead. Cook with heart-healthy oils, such as olive, canola, avocado, soybean, or sunflower oil. Meal planning  Eat a balanced diet that includes: 4 or more servings of fruits and 4 or more servings of vegetables each day. Try to fill one-half of your plate with fruits and vegetables. 6-8 servings of whole grains each day. Less than 6 oz  (170 g) of lean meat, poultry, or fish each day. A 3-oz (85-g) serving of meat is about the same size as a deck of cards. One egg equals 1 oz (28 g). 2-3 servings of low-fat dairy each day. One serving is 1 cup (237 mL). 1 serving of nuts, seeds, or beans 5 times each week. 2-3 servings of heart-healthy fats. Healthy fats called omega-3 fatty acids are found in foods such as walnuts, flaxseeds, fortified milks, and eggs. These fats are also found in cold-water fish, such as sardines, salmon, and mackerel. Limit how much you eat of: Canned or prepackaged foods. Food that is high in trans fat, such as some fried foods. Food that is high in saturated fat, such as fatty meat. Desserts and other sweets, sugary drinks, and other foods with added sugar. Full-fat dairy products. Do not salt foods before eating. Do not eat more than 4 egg yolks a week. Try to eat at least 2 vegetarian meals a week. Eat more home-cooked food and less restaurant, buffet, and fast food. Lifestyle When eating at a restaurant, ask that your food be prepared with less salt or no salt, if possible. If you drink alcohol: Limit how much you use to: 0-1 drink a day for women who are not pregnant. 0-2 drinks a day for men. Be aware of how much alcohol is in your drink. In the [place] one drink equals one 12 oz bottle of beer (355 mL), one 5 oz glass of wine (148 mL), or one 1 oz glass of hard liquor (44  mL). General information Avoid eating more than 2,300 mg of salt a day. If you have hypertension, you may need to reduce your sodium intake to 1,500 mg a day. Work with your health care provider to maintain a healthy body weight or to lose weight. Ask what an ideal weight is for you. Get at least 30 minutes of exercise that causes your heart to beat faster (aerobic exercise) most days of the week. Activities may include walking, swimming, or biking. Work with your health care provider or dietitian to adjust your eating plan to  your individual calorie needs. What foods should I eat? Fruits All fresh, dried, or frozen fruit. Canned fruit in natural juice (without added sugar). Vegetables Fresh or frozen vegetables (raw, steamed, roasted, or grilled). Low-sodium or reduced-sodium tomato and vegetable juice. Low-sodium or reduced-sodium tomato sauce and tomato paste. Low-sodium or reduced-sodium canned vegetables. Grains Whole-grain or whole-wheat bread. Whole-grain or whole-wheat pasta. Brown rice. Orpah Cobb. Bulgur. Whole-grain and low-sodium cereals. Pita bread. Low-fat, low-sodium crackers. Whole-wheat flour tortillas. Meats and other proteins Skinless chicken or [place]. Ground chicken or [place]. Pork with fat trimmed off. Fish and seafood. Egg whites. Dried beans, peas, or lentils. Unsalted nuts, nut butters, and seeds. Unsalted canned beans. Lean cuts of beef with fat trimmed off. Low-sodium, lean precooked or cured meat, such as sausages or meat loaves. Dairy Low-fat (1%) or fat-free (skim) milk. Reduced-fat, low-fat, or fat-free cheeses. Nonfat, low-sodium ricotta or cottage cheese. Low-fat or nonfat yogurt. Low-fat, low-sodium cheese. Fats and oils Soft margarine without trans fats. Vegetable oil. Reduced-fat, low-fat, or light mayonnaise and salad dressings (reduced-sodium). Canola, safflower, olive, avocado, soybean, and sunflower oils. Avocado. Seasonings and condiments Herbs. Spices. Seasoning mixes without salt. Other foods Unsalted popcorn and pretzels. Fat-free sweets. The items listed above may not be a complete list of foods and beverages you can eat. Contact a dietitian for more information. What foods should I avoid? Fruits Canned fruit in a light or heavy syrup. Fried fruit. Fruit in cream or butter sauce. Vegetables Creamed or fried vegetables. Vegetables in a cheese sauce. Regular canned vegetables (not low-sodium or reduced-sodium). Regular canned tomato sauce and paste (not low-sodium or  reduced-sodium). Regular tomato and vegetable juice (not low-sodium or reduced-sodium). Rosita Fire. Olives. Grains Baked goods made with fat, such as croissants, muffins, or some breads. Dry pasta or rice meal packs. Meats and other proteins Fatty cuts of meat. Ribs. Fried meat. Tomasa Blase. Bologna, salami, and other precooked or cured meats, such as sausages or meat loaves. Fat from the back of a pig (fatback). Bratwurst. Salted nuts and seeds. Canned beans with added salt. Canned or smoked fish. Whole eggs or egg yolks. Chicken or [place] with skin. Dairy Whole or 2% milk, cream, and half-and-half. Whole or full-fat cream cheese. Whole-fat or sweetened yogurt. Full-fat cheese. Nondairy creamers. Whipped toppings. Processed cheese and cheese spreads. Fats and oils Butter. Stick margarine. Lard. Shortening. Ghee. Bacon fat. Tropical oils, such as coconut, palm kernel, or palm oil. Seasonings and condiments Onion salt, garlic salt, seasoned salt, table salt, and sea salt. Worcestershire sauce. Tartar sauce. Barbecue sauce. Teriyaki sauce. Soy sauce, including reduced-sodium. Steak sauce. Canned and packaged gravies. Fish sauce. Oyster sauce. Cocktail sauce. Store-bought horseradish. Ketchup. Mustard. Meat flavorings and tenderizers. Bouillon cubes. Hot sauces. Pre-made or packaged marinades. Pre-made or packaged taco seasonings. Relishes. Regular salad dressings. Other foods Salted popcorn and pretzels. The items listed above may not be a complete list of foods and beverages you should avoid. Contact  a dietitian for more information. Where to find more information National Heart, Lung, and Blood Institute: PopSteam.is American Heart Association: www.heart.org Academy of Nutrition and Dietetics: www.eatright.org National Kidney Foundation: www.kidney.org Summary The DASH eating plan is a healthy eating plan that has been shown to reduce high blood pressure (hypertension). It may also reduce your risk  for type 2 diabetes, heart disease, and stroke. When on the DASH eating plan, aim to eat more fresh fruits and vegetables, whole grains, lean proteins, low-fat dairy, and heart-healthy fats. With the DASH eating plan, you should limit salt (sodium) intake to 2,300 mg a day. If you have hypertension, you may need to reduce your sodium intake to 1,500 mg a day. Work with your health care provider or dietitian to adjust your eating plan to your individual calorie needs. This information is not intended to replace advice given to you by your health care provider. Make sure you discuss any questions you have with your health care provider. Document Revised: 10/21/2019 Document Reviewed: 10/21/2019 Elsevier Patient Education  2022 Elsevier Inc. Health Maintenance for Postmenopausal Women Menopause is a normal process in which your ability to get pregnant comes to an end. This process happens slowly over many months or years, usually between the ages of 9 and 70. Menopause is complete when you have missed your menstrual periods for 12 months. It is important to talk with your health care provider about some of the most common conditions that affect women after menopause (postmenopausal women). These include heart disease, cancer, and bone loss (osteoporosis). Adopting a healthy lifestyle and getting preventive care can help to promote your health and wellness. The actions you take can also lower your chances of developing some of these common conditions. What should I know about menopause? During menopause, you may get a number of symptoms, such as: Hot flashes. These can be moderate or severe. Night sweats. Decrease in sex drive. Mood swings. Headaches. Tiredness. Irritability. Memory problems. Insomnia. Choosing to treat or not to treat these symptoms is a decision that you make with your health care provider. Do I need hormone replacement therapy? Hormone replacement therapy is effective in  treating symptoms that are caused by menopause, such as hot flashes and night sweats. Hormone replacement carries certain risks, especially as you become older. If you are thinking about using estrogen or estrogen with progestin, discuss the benefits and risks with your health care provider. What is my risk for heart disease and stroke? The risk of heart disease, heart attack, and stroke increases as you age. One of the causes may be a change in the body's hormones during menopause. This can affect how your body uses dietary fats, triglycerides, and cholesterol. Heart attack and stroke are medical emergencies. There are many things that you can do to help prevent heart disease and stroke. Watch your blood pressure High blood pressure causes heart disease and increases the risk of stroke. This is more likely to develop in people who have high blood pressure readings, are of African descent, or are overweight. Have your blood pressure checked: Every 3-5 years if you are 45-6 years of age. Every year if you are 104 years old or older. Eat a healthy diet  Eat a diet that includes plenty of vegetables, fruits, low-fat dairy products, and lean protein. Do not eat a lot of foods that are high in solid fats, added sugars, or sodium. Get regular exercise Get regular exercise. This is one of the most important things you can  do for your health. Most adults should: Try to exercise for at least 150 minutes each week. The exercise should increase your heart rate and make you sweat (moderate-intensity exercise). Try to do strengthening exercises at least twice each week. Do these in addition to the moderate-intensity exercise. Spend less time sitting. Even light physical activity can be beneficial. Other tips Work with your health care provider to achieve or maintain a healthy weight. Do not use any products that contain nicotine or tobacco, such as cigarettes, e-cigarettes, and chewing tobacco. If you need  help quitting, ask your health care provider. Know your numbers. Ask your health care provider to check your cholesterol and your blood sugar (glucose). Continue to have your blood tested as directed by your health care provider. Do I need screening for cancer? Depending on your health history and family history, you may need to have cancer screening at different stages of your life. This may include screening for: Breast cancer. Cervical cancer. Lung cancer. Colorectal cancer. What is my risk for osteoporosis? After menopause, you may be at increased risk for osteoporosis. Osteoporosis is a condition in which bone destruction happens more quickly than new bone creation. To help prevent osteoporosis or the bone fractures that can happen because of osteoporosis, you may take the following actions: If you are 50-61 years old, get at least 1,000 mg of calcium and at least 600 mg of vitamin D per day. If you are older than age 36 but younger than age 35, get at least 1,200 mg of calcium and at least 600 mg of vitamin D per day. If you are older than age 76, get at least 1,200 mg of calcium and at least 800 mg of vitamin D per day. Smoking and drinking excessive alcohol increase the risk of osteoporosis. Eat foods that are rich in calcium and vitamin D, and do weight-bearing exercises several times each week as directed by your health care provider. How does menopause affect my mental health? Depression may occur at any age, but it is more common as you become older. Common symptoms of depression include: Low or sad mood. Changes in sleep patterns. Changes in appetite or eating patterns. Feeling an overall lack of motivation or enjoyment of activities that you previously enjoyed. Frequent crying spells. Talk with your health care provider if you think that you are experiencing depression. General instructions See your health care provider for regular wellness exams and vaccines. This may  include: Scheduling regular health, dental, and eye exams. Getting and maintaining your vaccines. These include: Influenza vaccine. Get this vaccine each year before the flu season begins. Pneumonia vaccine. Shingles vaccine. Tetanus, diphtheria, and pertussis (Tdap) booster vaccine. Your health care provider may also recommend other immunizations. Tell your health care provider if you have ever been abused or do not feel safe at home. Summary Menopause is a normal process in which your ability to get pregnant comes to an end. This condition causes hot flashes, night sweats, decreased interest in sex, mood swings, headaches, or lack of sleep. Treatment for this condition may include hormone replacement therapy. Take actions to keep yourself healthy, including exercising regularly, eating a healthy diet, watching your weight, and checking your blood pressure and blood sugar levels. Get screened for cancer and depression. Make sure that you are up to date with all your vaccines. This information is not intended to replace advice given to you by your health care provider. Make sure you discuss any questions you have with your  health care provider. Document Revised: 11/10/2018 Document Reviewed: 11/10/2018 Elsevier Patient Education  2022 [employer].

## 2021-09-13 NOTE — Assessment & Plan Note (Signed)
Continue vit D 50k units weekly. Update levels. Not recently taking replacement.

## 2021-09-13 NOTE — Assessment & Plan Note (Addendum)
Encouraged healthy diet and lifestyle changes to affect sustainable weight loss.  

## 2021-09-13 NOTE — Assessment & Plan Note (Signed)
Advanced directive - discussed. Doesn't have advanced directive set up. Packet provided today. Husband would be HCPOA.

## 2021-09-13 NOTE — Assessment & Plan Note (Signed)
Showed how to check heart rate next time she feels palpitations.

## 2021-09-18 ENCOUNTER — Other Ambulatory Visit (INDEPENDENT_AMBULATORY_CARE_PROVIDER_SITE_OTHER): Payer: 59

## 2021-09-18 ENCOUNTER — Other Ambulatory Visit: Payer: Self-pay

## 2021-09-18 DIAGNOSIS — R002 Palpitations: Secondary | ICD-10-CM | POA: Diagnosis not present

## 2021-09-18 DIAGNOSIS — E669 Obesity, unspecified: Secondary | ICD-10-CM

## 2021-09-18 DIAGNOSIS — E559 Vitamin D deficiency, unspecified: Secondary | ICD-10-CM | POA: Diagnosis not present

## 2021-09-18 DIAGNOSIS — Z1211 Encounter for screening for malignant neoplasm of colon: Secondary | ICD-10-CM

## 2021-09-18 DIAGNOSIS — I10 Essential (primary) hypertension: Secondary | ICD-10-CM

## 2021-09-18 DIAGNOSIS — Z0000 Encounter for general adult medical examination without abnormal findings: Secondary | ICD-10-CM

## 2021-09-18 LAB — TSH: TSH: 3.67 u[IU]/mL (ref 0.35–5.50)

## 2021-09-18 LAB — LIPID PANEL
Cholesterol: 226 mg/dL — ABNORMAL HIGH (ref 0–200)
HDL: 60.3 mg/dL (ref >39.00)
LDL Cholesterol: 140 mg/dL — ABNORMAL HIGH (ref 0–99)
NonHDL: 165.49
Total CHOL/HDL Ratio: 4
Triglycerides: 128 mg/dL (ref 0.0–149.0)
VLDL: 25.6 mg/dL (ref 0.0–40.0)

## 2021-09-18 LAB — BASIC METABOLIC PANEL
BUN: 11 mg/dL (ref 6–23)
CO2: 28 mEq/L (ref 19–32)
Calcium: 9.8 mg/dL (ref 8.4–10.5)
Chloride: 105 mEq/L (ref 96–112)
Creatinine, Ser: 0.91 mg/dL (ref 0.40–1.20)
GFR: 66.84 mL/min (ref >60.00)
Glucose, Bld: 98 mg/dL (ref 70–99)
Potassium: 3.6 mEq/L (ref 3.5–5.1)
Sodium: 141 mEq/L (ref 135–145)

## 2021-09-18 LAB — VITAMIN D 25 HYDROXY (VIT D DEFICIENCY, FRACTURES): VITD: 22.35 ng/mL — ABNORMAL LOW (ref 30.00–100.00)

## 2021-09-19 ENCOUNTER — Other Ambulatory Visit (INDEPENDENT_AMBULATORY_CARE_PROVIDER_SITE_OTHER): Payer: 59

## 2021-09-19 DIAGNOSIS — Z1211 Encounter for screening for malignant neoplasm of colon: Secondary | ICD-10-CM

## 2021-09-19 LAB — FECAL OCCULT BLOOD, IMMUNOCHEMICAL: Fecal Occult Bld: NEGATIVE

## 2021-09-20 ENCOUNTER — Encounter: Payer: Self-pay | Admitting: Family Medicine

## 2021-09-20 LAB — FECAL OCCULT BLOOD, GUAIAC: Fecal Occult Blood: NEGATIVE

## 2021-09-23 ENCOUNTER — Encounter: Payer: Self-pay | Admitting: Family Medicine

## 2021-09-23 DIAGNOSIS — E785 Hyperlipidemia, unspecified: Secondary | ICD-10-CM | POA: Insufficient documentation

## 2021-09-24 ENCOUNTER — Telehealth: Payer: Self-pay | Admitting: Family Medicine

## 2021-09-24 NOTE — Telephone Encounter (Signed)
Pt returning call. Would like a call back stated Detailed message can be left on voicemail #[phone]

## 2021-09-24 NOTE — Telephone Encounter (Signed)
Called and spoke with the patient and reiterated the detailed VM. Patient stated she wanted to do whatever she could not to take another pill. Please advise.

## 2021-09-24 NOTE — Telephone Encounter (Signed)
Recommend she focus on low chol diet over the next year - we can recheck at next labs.  More fruits and vegetables, more fish, less red meat and dairy products.  More soy, nuts, beans, barley, lentils, oats and plant sterol ester enriched margarine instead of butter.

## 2021-09-24 NOTE — Progress Notes (Signed)
Tried calling the patient and he did not answer. LVM for patient to call back.   

## 2021-09-24 NOTE — Telephone Encounter (Signed)
Completed in other encounter.

## 2021-09-24 NOTE — Telephone Encounter (Signed)
Pt returning call

## 2021-09-25 NOTE — Telephone Encounter (Signed)
Patient notified as instructed by telephone and verbalized understanding. 

## 2021-09-25 NOTE — Telephone Encounter (Signed)
Left message on voicemail for patient to call the office back. 

## 2022-08-22 ENCOUNTER — Other Ambulatory Visit: Payer: Self-pay | Admitting: Family Medicine

## 2022-08-22 DIAGNOSIS — I10 Essential (primary) hypertension: Secondary | ICD-10-CM

## 2022-09-24 ENCOUNTER — Other Ambulatory Visit (INDEPENDENT_AMBULATORY_CARE_PROVIDER_SITE_OTHER): Payer: Medicare Other

## 2022-09-24 ENCOUNTER — Other Ambulatory Visit: Payer: Self-pay | Admitting: Family Medicine

## 2022-09-24 DIAGNOSIS — E785 Hyperlipidemia, unspecified: Secondary | ICD-10-CM

## 2022-09-24 DIAGNOSIS — E559 Vitamin D deficiency, unspecified: Secondary | ICD-10-CM

## 2022-09-24 LAB — LIPID PANEL
Cholesterol: 211 mg/dL — ABNORMAL HIGH (ref 0–200)
HDL: 52.1 mg/dL (ref >39.00)
LDL Cholesterol: 136 mg/dL — ABNORMAL HIGH (ref 0–99)
NonHDL: 159.23
Total CHOL/HDL Ratio: 4
Triglycerides: 116 mg/dL (ref 0.0–149.0)
VLDL: 23.2 mg/dL (ref 0.0–40.0)

## 2022-09-24 LAB — COMPREHENSIVE METABOLIC PANEL
ALT: 14 U/L (ref 0–35)
AST: 17 U/L (ref 0–37)
Albumin: 4.4 g/dL (ref 3.5–5.2)
Alkaline Phosphatase: 101 U/L (ref 39–117)
BUN: 13 mg/dL (ref 6–23)
CO2: 28 mEq/L (ref 19–32)
Calcium: 10.2 mg/dL (ref 8.4–10.5)
Chloride: 105 mEq/L (ref 96–112)
Creatinine, Ser: 0.91 mg/dL (ref 0.40–1.20)
GFR: 66.36 mL/min (ref >60.00)
Glucose, Bld: 101 mg/dL — ABNORMAL HIGH (ref 70–99)
Potassium: 3.5 mEq/L (ref 3.5–5.1)
Sodium: 142 mEq/L (ref 135–145)
Total Bilirubin: 0.4 mg/dL (ref 0.2–1.2)
Total Protein: 7.2 g/dL (ref 6.0–8.3)

## 2022-09-24 LAB — VITAMIN D 25 HYDROXY (VIT D DEFICIENCY, FRACTURES): VITD: 20.71 ng/mL — ABNORMAL LOW (ref 30.00–100.00)

## 2022-09-25 ENCOUNTER — Other Ambulatory Visit: Payer: Self-pay | Admitting: Family Medicine

## 2022-09-25 DIAGNOSIS — Z1231 Encounter for screening mammogram for malignant neoplasm of breast: Secondary | ICD-10-CM

## 2022-10-01 ENCOUNTER — Ambulatory Visit (INDEPENDENT_AMBULATORY_CARE_PROVIDER_SITE_OTHER): Payer: Medicare Other | Admitting: Family Medicine

## 2022-10-01 ENCOUNTER — Encounter: Payer: Self-pay | Admitting: Family Medicine

## 2022-10-01 ENCOUNTER — Other Ambulatory Visit (HOSPITAL_COMMUNITY)
Admission: RE | Admit: 2022-10-01 | Discharge: 2022-10-01 | Disposition: A | Discharge status: Home / Self Care | Payer: Medicare Other | Source: Ambulatory Visit | Attending: Family Medicine | Admitting: Family Medicine

## 2022-10-01 VITALS — BP 134/78 | HR 62 | Temp 97.3°F | Ht 61.5 in | Wt 172.4 lb

## 2022-10-01 DIAGNOSIS — I10 Essential (primary) hypertension: Secondary | ICD-10-CM

## 2022-10-01 DIAGNOSIS — H547 Unspecified visual loss: Secondary | ICD-10-CM | POA: Insufficient documentation

## 2022-10-01 DIAGNOSIS — Z01419 Encounter for gynecological examination (general) (routine) without abnormal findings: Secondary | ICD-10-CM

## 2022-10-01 DIAGNOSIS — G43009 Migraine without aura, not intractable, without status migrainosus: Secondary | ICD-10-CM

## 2022-10-01 DIAGNOSIS — Z7189 Other specified counseling: Secondary | ICD-10-CM | POA: Diagnosis not present

## 2022-10-01 DIAGNOSIS — E785 Hyperlipidemia, unspecified: Secondary | ICD-10-CM

## 2022-10-01 DIAGNOSIS — Z1211 Encounter for screening for malignant neoplasm of colon: Secondary | ICD-10-CM

## 2022-10-01 DIAGNOSIS — E66811 Obesity, class 1: Secondary | ICD-10-CM

## 2022-10-01 DIAGNOSIS — E669 Obesity, unspecified: Secondary | ICD-10-CM

## 2022-10-01 DIAGNOSIS — E2839 Other primary ovarian failure: Secondary | ICD-10-CM | POA: Diagnosis not present

## 2022-10-01 DIAGNOSIS — Z1151 Encounter for screening for human papillomavirus (HPV): Secondary | ICD-10-CM | POA: Insufficient documentation

## 2022-10-01 DIAGNOSIS — Z780 Asymptomatic menopausal state: Secondary | ICD-10-CM

## 2022-10-01 DIAGNOSIS — Z0000 Encounter for general adult medical examination without abnormal findings: Secondary | ICD-10-CM | POA: Diagnosis not present

## 2022-10-01 DIAGNOSIS — E559 Vitamin D deficiency, unspecified: Secondary | ICD-10-CM | POA: Diagnosis not present

## 2022-10-01 MED ORDER — ATENOLOL 100 MG PO TABS: 100.0000 mg | ORAL_TABLET | Freq: Every day | ORAL | 3 refills | Status: DC

## 2022-10-01 MED ORDER — VITAMIN D3 1.25 MG (50000 UT) PO CAPS: ORAL_CAPSULE | ORAL | 3 refills | Status: DC

## 2022-10-01 MED ORDER — VALSARTAN-HYDROCHLOROTHIAZIDE 160-12.5 MG PO TABS: 1.0000 | ORAL_TABLET | Freq: Every day | ORAL | 3 refills | Status: DC

## 2022-10-01 NOTE — Assessment & Plan Note (Signed)
Chronic, stable. Continue current regimen. 

## 2022-10-01 NOTE — Assessment & Plan Note (Signed)
Advanced directive - discussed. Doesn't have advanced directive set up. Packet provided today. Husband would be HCPOA. doesn't think would want life support or even CPR.

## 2022-10-01 NOTE — Assessment & Plan Note (Signed)
Noted to left eye- encouraged she schedule eye exam as due

## 2022-10-01 NOTE — Patient Instructions (Addendum)
EKG today  Schedule eye exam to check left eye vision.  Pass by lab to pick up stool kit. I have ordered dexa bone density scan  - call breast center to try to schedule on same day as mammogram. [place] [phone] Restart vitamin D weekly.  Pelvic/pap smear today, consider spacing out if normal.  Consider pneumonia shot and shingles shots, the shingles shot would be through the pharmacy.  Work on advanced directive/living will you have at home.  Good to see you today Return as needed or in 1 year for next physical/wellness visit.   Health Maintenance After Age 73 After age 41, you are at a higher risk for certain long-term diseases and infections as well as injuries from falls. Falls are a major cause of broken bones and head injuries in people who are older than age 43. Getting regular preventive care can help to keep you healthy and well. Preventive care includes getting regular testing and making lifestyle changes as recommended by your health care provider. Talk with your health care provider about: Which screenings and tests you should have. A screening is a test that checks for a disease when you have no symptoms. A diet and exercise plan that is right for you. What should I know about screenings and tests to prevent falls? Screening and testing are the best ways to find a health problem early. Early diagnosis and treatment give you the best chance of managing medical conditions that are common after age 63. Certain conditions and lifestyle choices may make you more likely to have a fall. Your health care provider may recommend: Regular vision checks. Poor vision and conditions such as cataracts can make you more likely to have a fall. If you wear glasses, make sure to get your prescription updated if your vision changes. Medicine review. Work with your health care provider to regularly review all of the medicines you are taking, including over-the-counter medicines.  Ask your health care provider about any side effects that may make you more likely to have a fall. Tell your health care provider if any medicines that you take make you feel dizzy or sleepy. Strength and balance checks. Your health care provider may recommend certain tests to check your strength and balance while standing, walking, or changing positions. Foot health exam. Foot pain and numbness, as well as not wearing proper footwear, can make you more likely to have a fall. Screenings, including: Osteoporosis screening. Osteoporosis is a condition that causes the bones to get weaker and break more easily. Blood pressure screening. Blood pressure changes and medicines to control blood pressure can make you feel dizzy. Depression screening. You may be more likely to have a fall if you have a fear of falling, feel depressed, or feel unable to do activities that you used to do. Alcohol use screening. Using too much alcohol can affect your balance and may make you more likely to have a fall. Follow these instructions at home: Lifestyle Do not drink alcohol if: Your health care provider tells you not to drink. If you drink alcohol: Limit how much you have to: 0-1 drink a day for women. 0-2 drinks a day for men. Know how much alcohol is in your drink. In the [place] one drink equals one 12 oz bottle of beer (355 mL), one 5 oz glass of wine (148 mL), or one 1 oz glass of hard liquor (44 mL). Do not use any products that contain nicotine or tobacco. These  products include cigarettes, chewing tobacco, and vaping devices, such as e-cigarettes. If you need help quitting, ask your health care provider. Activity  Follow a regular exercise program to stay fit. This will help you maintain your balance. Ask your health care provider what types of exercise are appropriate for you. If you need a cane or walker, use it as recommended by your health care provider. Wear supportive shoes that have nonskid  soles. Safety  Remove any tripping hazards, such as rugs, cords, and clutter. Install safety equipment such as grab bars in bathrooms and safety rails on stairs. Keep rooms and walkways well-lit. General instructions Talk with your health care provider about your risks for falling. Tell your health care provider if: You fall. Be sure to tell your health care provider about all falls, even ones that seem minor. You feel dizzy, tiredness (fatigue), or off-balance. Take over-the-counter and prescription medicines only as told by your health care provider. These include supplements. Eat a healthy diet and maintain a healthy weight. A healthy diet includes low-fat dairy products, low-fat (lean) meats, and fiber from whole grains, beans, and lots of fruits and vegetables. Stay current with your vaccines. Schedule regular health, dental, and eye exams. Summary Having a healthy lifestyle and getting preventive care can help to protect your health and wellness after age 86. Screening and testing are the best way to find a health problem early and help you avoid having a fall. Early diagnosis and treatment give you the best chance for managing medical conditions that are more common for people who are older than age 15. Falls are a major cause of broken bones and head injuries in people who are older than age 70. Take precautions to prevent a fall at home. Work with your health care provider to learn what changes you can make to improve your health and wellness and to prevent falls. This information is not intended to replace advice given to you by your health care provider. Make sure you discuss any questions you have with your health care provider. Document Revised: 04/08/2021 Document Reviewed: 04/08/2021 Elsevier Patient Education  [place].

## 2022-10-01 NOTE — Assessment & Plan Note (Addendum)
Off OCPs since 2017 Update pelvic/pap today.  Ordered DEXA.  Discussed calcium/vit D intake

## 2022-10-01 NOTE — Assessment & Plan Note (Signed)
Stable period followed by [facility] wellness center

## 2022-10-01 NOTE — Assessment & Plan Note (Signed)
Continue regular walking routine.

## 2022-10-01 NOTE — Assessment & Plan Note (Signed)

## 2022-10-01 NOTE — Assessment & Plan Note (Signed)
Levels low - restart vit D weekly.

## 2022-10-01 NOTE — Progress Notes (Addendum)
Patient ID: Julie Baker, female    DOB: January 20, 1956, 66 y.o.   MRN: [id number]  This visit was conducted in person.  BP 134/78   Pulse 62   Temp (!) 97.3 F (36.3 C) (Temporal)   Ht 5' 1.5" (1.562 m)   Wt 172 lb 6 oz (78.2 kg)   LMP 05/22/2015   SpO2 98%   BMI 32.04 kg/m    CC: welcome to medicare  Subjective:   HPI: Julie Baker is a 66 y.o. female presenting on 10/01/2022 for Welcome to Medicare   Hearing Screening   _0  _1  _2  _3   Right ear _4 Left ear _5 Vision Screening   Right eye Left eye Both eyes  Without correction _6  With correction     Some difficulty with left eye vision - she hasn't noted difficulty [place] Visit from 10/01/2022 in [place] at [place]  PHQ-2 Total Score 0          10/01/2022   11:01 AM 08/01/2020    2:04 PM  [place] in the past year? 0 0  Number falls in past yr:  0  Injury with Fall?  0  Follow up  Falls evaluation completed   Migraine - stable period on topamax 117m daily with imitrex abortively. Has a few migraines per month. [facility] ([facility].    Preventative: Colon screening - always normal stool kits. Requests rpt iFOB.  Mammogram - 08/2021 [facility] @ [facility]. Does not do breast exams at home. Pending rpt mammo 10/2022. [facility] woman - pelvic exam Q2 yrs given fmhx ovarian cancer (mother) and uterine (aunt). Last pap smear normal 08/2020. Rpt today. Denies vaginal bleeding or pelvic pain/pressure, abd bloating/gassiness.  LMP 05/2015  DEXA scan - will try to schedule with mammogram next month. She has been off weekly replacement for a while Lung cancer screening - not eligible  Flu shot - does not get.  CC[place]iff3/2021, 03/2020, thinks had booster x1 - will check records.  Tetanus 2009. Tdap 2019.  Pneumococcal - discussed, will consider Shingrix - discussed, to consider  Advanced directive - discussed.  Doesn't have advanced directive set up. Packet provided today. Husband would be HCPOA. doesn't think would want life support or even CPR.  Seat belt use discussed Sunscreen use discussed. No changing moles on skin.  Non smoker  Alcohol - none Dentist - Q6 mo Eye exam - yearly - due Bowel - no constipation Bladder - no incontinence   Caffeine: 1 cup coffee/day   Married, lives with husband.   Occupation: [employer] - retired last month  Activity: walks 1 hour every day  Diet: encouraged to avoid salt and sugar in diet     Relevant past medical, surgical, family and social history reviewed and updated as indicated. Interim medical history since our last visit reviewed. Allergies and medications reviewed and updated. Outpatient Medications Prior to Visit  Medication Sig Dispense Refill   SUMAtriptan (IMITREX) 100 MG tablet Take 1 tablet (100 mg total) by mouth once for 1 dose. May repeat in 2 hours x1 if ongoing migraine     topiramate (TOPAMAX) 100 MG tablet Take 1 tablet (100 mg total) by mouth at bedtime.     atenolol (TENORMIN) 100 MG tablet TAKE 1 TABLET BY MOUTH EVERY DAY 90 tablet 0   valsartan-hydrochlorothiazide (DIOVAN-HCT) 160-12.5 MG  tablet Take 1 tablet by mouth daily. 90 tablet 3   Cholecalciferol (VITAMIN D3) 1.25 MG (50000 UT) CAPS TAKE ONE CAPSULE BY MOUTH ONE TIME PER WEEK (Patient not taking: Reported on 10/01/2022) 12 capsule 3   No facility-administered medications prior to visit.     Per HPI unless specifically indicated in ROS section below Review of Systems  Objective:  BP 134/78   Pulse 62   Temp (!) 97.3 F (36.3 C) (Temporal)   Ht 5' 1.5" (1.562 m)   Wt 172 lb 6 oz (78.2 kg)   LMP 05/22/2015   SpO2 98%   BMI 32.04 kg/m   Wt Readings from Last 3 Encounters:  10/01/22 172 lb 6 oz (78.2 kg)  09/13/21 172 lb 3 oz (78.1 kg)  08/01/20 176 lb 14.4 oz (80.2 kg)      Physical Exam Vitals and nursing note reviewed. Exam conducted with a chaperone  present.  Constitutional:      Appearance: Normal appearance. She is not ill-appearing.  HENT:     Head: Normocephalic and atraumatic.     Right Ear: Tympanic membrane, ear canal and external ear normal. There is no impacted cerumen.     Left Ear: Tympanic membrane, ear canal and external ear normal. There is no impacted cerumen.  Eyes:     General:        Right eye: No discharge.        Left eye: No discharge.     Extraocular Movements: Extraocular movements intact.     Conjunctiva/sclera: Conjunctivae normal.     Pupils: Pupils are equal, round, and reactive to light.  Neck:     Thyroid: No thyroid mass or thyromegaly.  Cardiovascular:     Rate and Rhythm: Normal rate and regular rhythm.     Pulses: Normal pulses.     Heart sounds: Normal heart sounds. No murmur heard. Pulmonary:     Effort: Pulmonary effort is normal. No respiratory distress.     Breath sounds: Normal breath sounds. No wheezing, rhonchi or rales.  Abdominal:     General: Bowel sounds are normal. There is no distension.     Palpations: Abdomen is soft. There is no mass.     Tenderness: There is no abdominal tenderness. There is no guarding or rebound.     Hernia: No hernia is present.  Genitourinary:    Exam position: Supine.     Labia:        Right: No rash, tenderness or lesion.        Left: No rash, tenderness or lesion.      Vagina: Normal.     Cervix: Normal.     Uterus: Normal.      Adnexa: Right adnexa normal and left adnexa normal.     Comments: Pap performed on cervix Musculoskeletal:     Cervical back: Normal range of motion and neck supple. No rigidity.     Right lower leg: No edema.     Left lower leg: No edema.  Lymphadenopathy:     Cervical: No cervical adenopathy.  Skin:    General: Skin is warm and dry.     Findings: No rash.  Neurological:     General: No focal deficit present.     Mental Status: She is alert. Mental status is at baseline.     Comments:  Recall 3/3 Calculation  3/5 DLORW  Psychiatric:        Mood and Affect: Mood normal.  Behavior: Behavior normal.       Results for orders placed or performed in visit on 09/24/22  VITAMIN D 25 Hydroxy (Vit-D Deficiency, Fractures)  Result Value Ref Range   VITD 20.71 (L) 30.00 - 100.00 ng/mL  Comprehensive metabolic panel  Result Value Ref Range   Sodium 142 135 - 145 mEq/L   Potassium 3.5 3.5 - 5.1 mEq/L   Chloride 105 96 - 112 mEq/L   CO2 28 19 - 32 mEq/L   Glucose, Bld 101 (H) 70 - 99 mg/dL   BUN 13 6 - 23 mg/dL   Creatinine, Ser 0.91 0.40 - 1.20 mg/dL   Total Bilirubin 0.4 0.2 - 1.2 mg/dL   Alkaline Phosphatase 101 39 - 117 U/L   AST 17 0 - 37 U/L   ALT 14 0 - 35 U/L   Total Protein 7.2 6.0 - 8.3 g/dL   Albumin 4.4 3.5 - 5.2 g/dL   GFR 66.36 >60.00 mL/min   Calcium 10.2 8.4 - 10.5 mg/dL  Lipid panel  Result Value Ref Range   Cholesterol 211 (H) 0 - 200 mg/dL   Triglycerides 116.0 0.0 - 149.0 mg/dL   HDL 52.10 >39.00 mg/dL   VLDL 23.2 0.0 - 40.0 mg/dL   LDL Cholesterol 136 (H) 0 - 99 mg/dL   Total CHOL/HDL Ratio 4    NonHDL 159.23    EKG - sinus bradycardia high 50s, normal axis, intervals, no hypertrophy, nonspecific inferior ST changes with T wave inversion, no old to compare   Assessment & Plan:   Problem List Items Addressed This Visit     Advanced directives, counseling/discussion (Chronic)    Advanced directive - discussed. Doesn't have advanced directive set up. Packet provided today. Husband would be HCPOA. doesn't think would want life support or even CPR.       Welcome to Medicare preventive visit - Primary (Chronic)    I have personally reviewed the Medicare Annual Wellness questionnaire and have noted 1. The patient's medical and social history 2. Their use of alcohol, tobacco or illicit drugs 3. Their current medications and supplements 4. The patient's functional ability including ADL's, fall risks, home safety risks and hearing or visual impairment. Cognitive  function has been assessed and addressed as indicated.  5. Diet and physical activity 6. Evidence for depression or mood disorders The patients weight, height, BMI have been recorded in the chart. I have made referrals, counseling and provided education to the patient based on review of the above and I have provided the pt with a written personalized care plan for preventive services. Provider list updated.. See scanned questionairre as needed for further documentation. Reviewed preventative protocols and updated unless pt declined.       Relevant Orders   EKG 12-Lead (Completed)   Migraine without aura    Stable period followed by [facility] center      Relevant Medications   valsartan-hydrochlorothiazide (DIOVAN-HCT) 160-12.5 MG tablet   atenolol (TENORMIN) 100 MG tablet   Essential hypertension    Chronic, stable. Continue current regimen.       Relevant Medications   valsartan-hydrochlorothiazide (DIOVAN-HCT) 160-12.5 MG tablet   atenolol (TENORMIN) 100 MG tablet   Post-menopausal    Off OCPs since 2017 Update pelvic/pap today.  Ordered DEXA.  Discussed calcium/vit D intake      Vitamin D deficiency    Levels low - restart vit D weekly.       Obesity (BMI 30.0-34.9)    Continue regular walking  routine.       HLD (hyperlipidemia)    Reviewed diet changes to improve cholesterol levels.  The 10-year ASCVD risk score (Arnett DK, et al., 2019) is: 10.8%   Values used to calculate the score:     Age: 33 years     Sex: Female     Is Non-Hispanic African American: Yes     Diabetic: No     Tobacco smoker: No     Systolic Blood Pressure: 382 mmHg     Is BP treated: Yes     HDL Cholesterol: 52.1 mg/dL     Total Cholesterol: 211 mg/dL       Relevant Medications   valsartan-hydrochlorothiazide (DIOVAN-HCT) 160-12.5 MG tablet   atenolol (TENORMIN) 100 MG tablet   Decreased visual acuity    Noted to left eye- encouraged she schedule eye exam as due       Other Visit Diagnoses     Estrogen deficiency       Relevant Orders   DG Bone Density   Special screening for malignant neoplasms, colon       Relevant Orders   Fecal occult blood, imunochemical   Encounter for annual routine gynecological examination       Relevant Orders   Cytology - PAP        Meds ordered this encounter  Medications   valsartan-hydrochlorothiazide (DIOVAN-HCT) 160-12.5 MG tablet    Sig: Take 1 tablet by mouth daily.    Dispense:  90 tablet    Refill:  3   atenolol (TENORMIN) 100 MG tablet    Sig: Take 1 tablet (100 mg total) by mouth daily.    Dispense:  90 tablet    Refill:  3   Cholecalciferol (VITAMIN D3) 1.25 MG (50000 UT) CAPS    Sig: TAKE ONE CAPSULE BY MOUTH ONE TIME PER WEEK    Dispense:  12 capsule    Refill:  3   Orders Placed This Encounter  Procedures   Fecal occult blood, imunochemical    Standing Status:   Future    Standing Expiration Date:   10/02/2023   DG Bone Density    Standing Status:   Future    Standing Expiration Date:   10/02/2023    Order Specific Question:   Reason for Exam (SYMPTOM  OR DIAGNOSIS REQUIRED)    Answer:   osteoporosis screen    Order Specific Question:   Preferred imaging location?    Answer:   [facility]   EKG 12-Lead    Patient instructions: EKG today  Schedule eye exam to check left eye vision.  Pass by lab to pick up stool kit. I have ordered dexa bone density scan  - call breast center to try to schedule on same day as mammogram. [place] [phone] Restart vitamin D weekly.  Pelvic/pap smear today, consider spacing out if normal.  Consider pneumonia shot and shingles shots, the shingles shot would be through the pharmacy.  Work on advanced directive/living will you have at home.  Good to see you today Return as needed or in 1 year for next physical/wellness visit.   Follow up plan: Return in about 1 year (around 10/02/2023), or if symptoms worsen or fail to  improve, for annual exam, prior fasting for blood work, medicare wellness visit.  Ria Bush, MD

## 2022-10-01 NOTE — Assessment & Plan Note (Signed)
Reviewed diet changes to improve cholesterol levels.  The 10-year ASCVD risk score (Arnett DK, et al., 2019) is: 10.8%   Values used to calculate the score:     Age: 66 years     Sex: Female     Is Non-Hispanic African American: Yes     Diabetic: No     Tobacco smoker: No     Systolic Blood Pressure: 295 mmHg     Is BP treated: Yes     HDL Cholesterol: 52.1 mg/dL     Total Cholesterol: 211 mg/dL

## 2022-10-02 ENCOUNTER — Telehealth: Payer: Self-pay

## 2022-10-02 NOTE — Telephone Encounter (Signed)
Patient called to get results back.

## 2022-10-02 NOTE — Telephone Encounter (Signed)
Lvm asking pt to call back.  Need to relay EKG results (see ECG, Result Notes- 10/01/22).  EKG: Your EKG returned overall ok.

## 2022-10-02 NOTE — Telephone Encounter (Signed)
Spoke with pt relaying results.  Verbalizes understanding.  

## 2022-10-03 LAB — CYTOLOGY - PAP
Comment: NEGATIVE
Diagnosis: NEGATIVE
High risk HPV: NEGATIVE

## 2022-10-08 ENCOUNTER — Other Ambulatory Visit: Payer: Self-pay

## 2022-10-08 DIAGNOSIS — Z1211 Encounter for screening for malignant neoplasm of colon: Secondary | ICD-10-CM

## 2022-10-10 LAB — FECAL OCCULT BLOOD, IMMUNOCHEMICAL: Fecal Occult Bld: NEGATIVE

## 2022-11-19 ENCOUNTER — Ambulatory Visit: Payer: Medicare Other

## 2022-12-04 DIAGNOSIS — G43719 Chronic migraine without aura, intractable, without status migrainosus: Secondary | ICD-10-CM | POA: Diagnosis not present

## 2022-12-23 IMAGING — MG MM DIGITAL SCREENING BILAT W/ TOMO AND CAD
8 series · 8 of 24 positions shown · non-contrast
Comparison: Previous exam(s).

CLINICAL DATA: Screening.

EXAM:
DIGITAL SCREENING BILATERAL MAMMOGRAM WITH TOMOSYNTHESIS AND CAD
TECHNIQUE: Bilateral screening digital craniocaudal and mediolateral oblique
mammograms were obtained. Bilateral screening digital breast
tomosynthesis was performed. The images were evaluated with
computer-aided detection.

[R CC synth-2D]
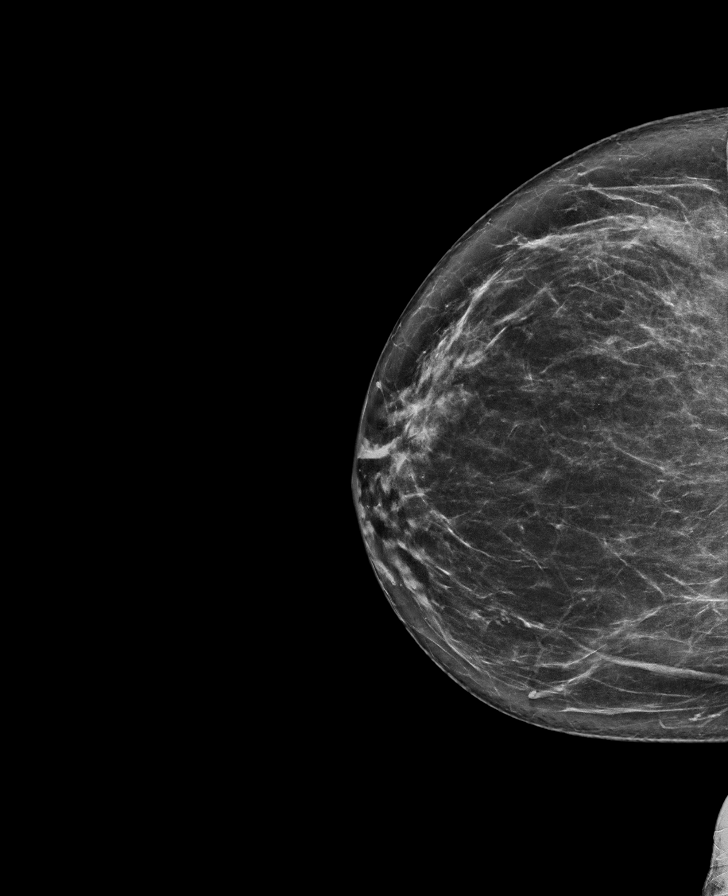

[L CC synth-2D]
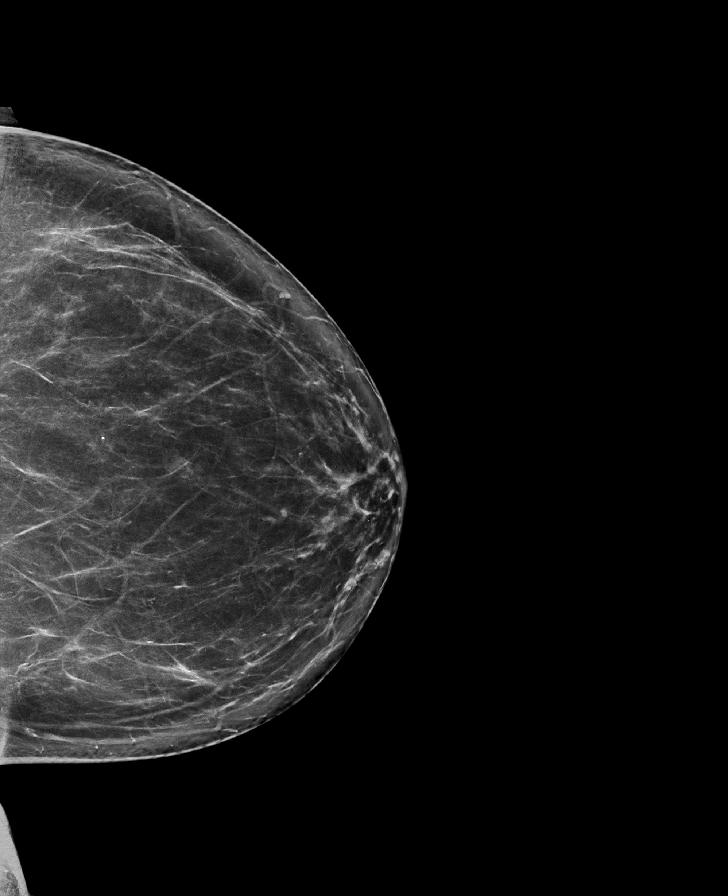

[L MLO synth-2D]
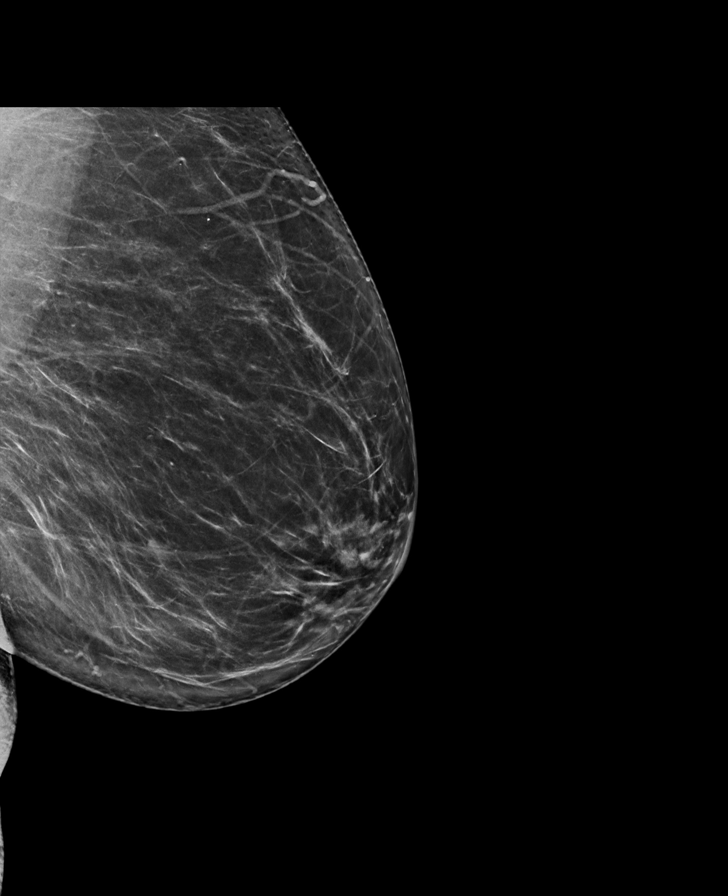

[R MLO synth-2D]
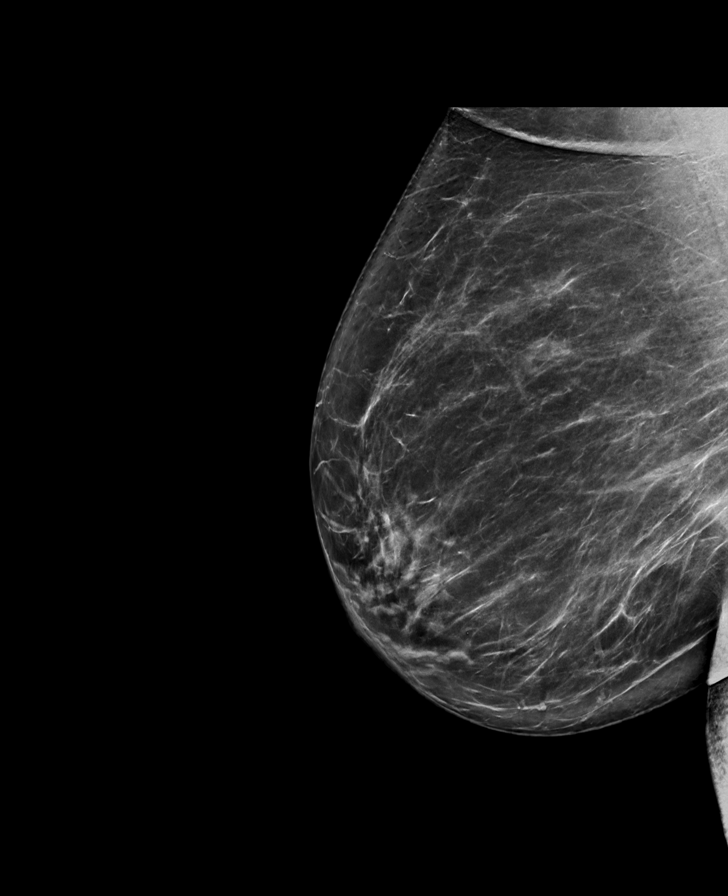

[R MLO tomo · tomo slice 45/89.0]
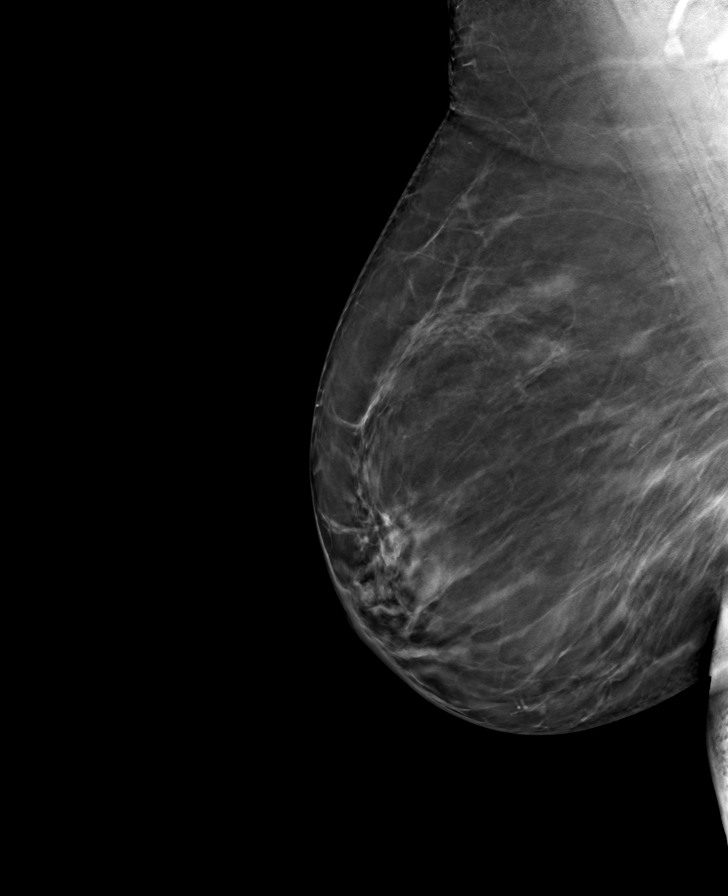

[R CC tomo · tomo slice 39/78.0]
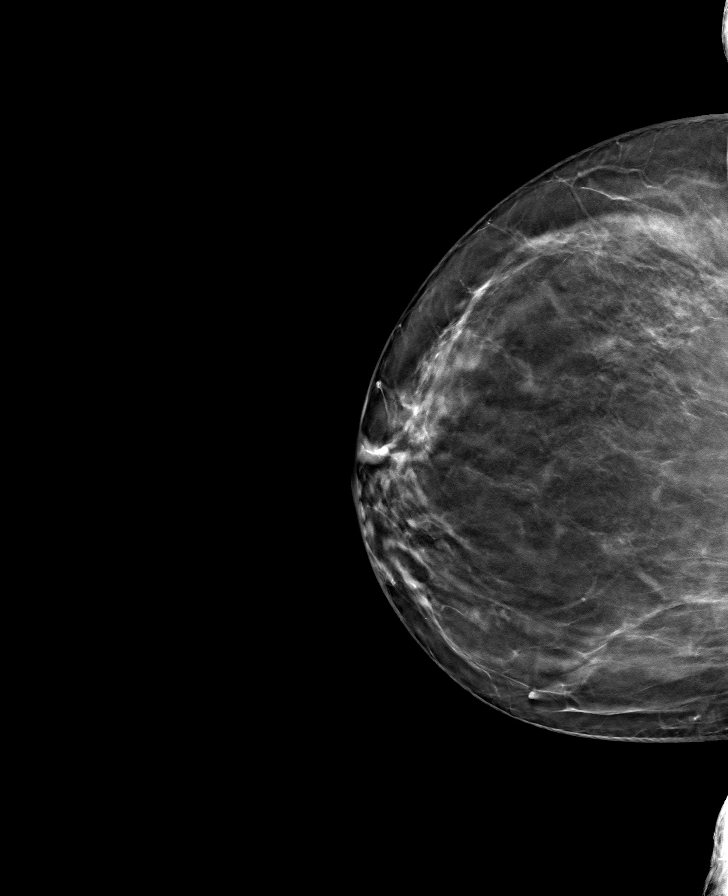

[L CC tomo · tomo slice 41/80.0]
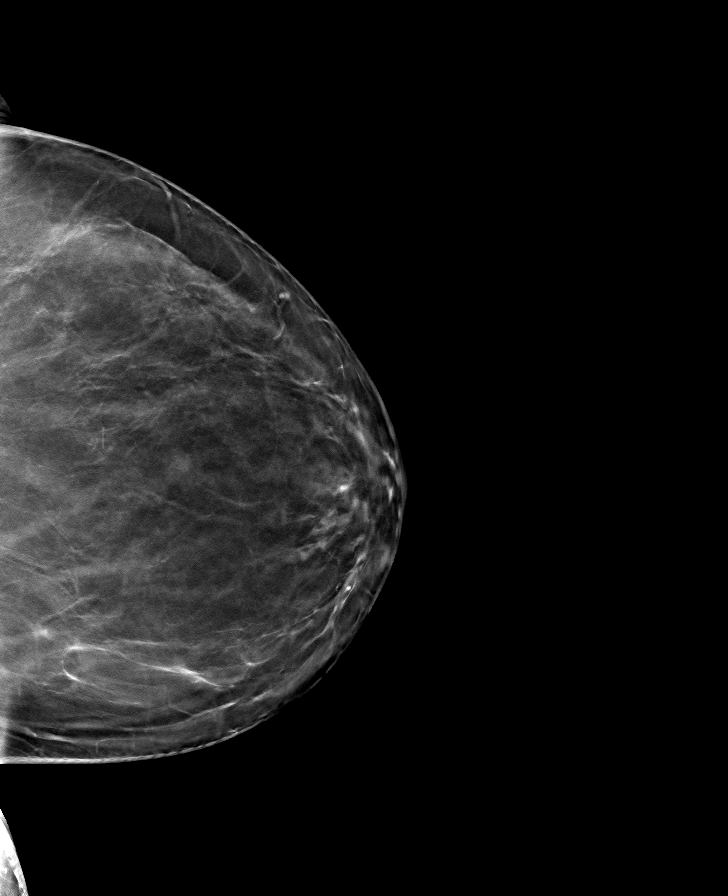

[L MLO tomo · tomo slice 41/81.0]
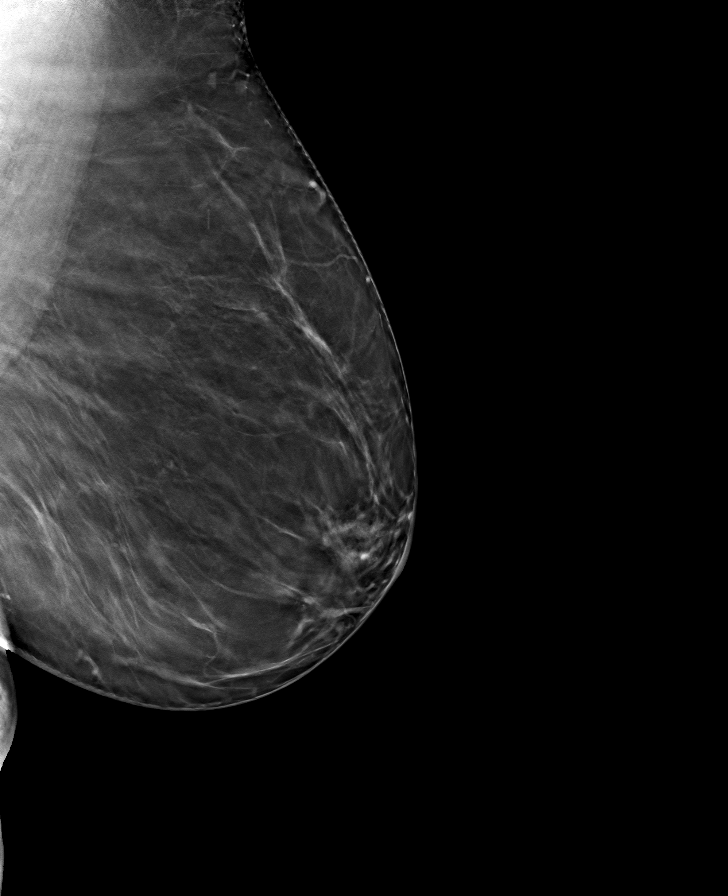

[8 of 24 positions shown; findings below may reference images not displayed]

ACR Breast Density Category b: There are scattered areas of
fibroglandular density.
FINDINGS: There are no findings suspicious for malignancy.
IMPRESSION: No mammographic evidence of malignancy. A result letter of this
screening mammogram will be mailed directly to the patient.

RECOMMENDATION:
Screening mammogram in one year. (Code:51-O-LD2)

BI-RADS CATEGORY  1: Negative.

## 2023-01-13 ENCOUNTER — Ambulatory Visit
Admission: RE | Admit: 2023-01-13 | Discharge: 2023-01-13 | Disposition: A | Discharge status: Home / Self Care | Payer: Medicare Other | Source: Ambulatory Visit | Attending: Family Medicine | Admitting: Family Medicine

## 2023-01-13 DIAGNOSIS — Z1231 Encounter for screening mammogram for malignant neoplasm of breast: Secondary | ICD-10-CM

## 2023-03-13 ENCOUNTER — Ambulatory Visit
Admission: RE | Admit: 2023-03-13 | Discharge: 2023-03-13 | Disposition: A | Discharge status: Home / Self Care | Payer: Medicare Other | Source: Ambulatory Visit | Attending: Family Medicine | Admitting: Family Medicine

## 2023-03-13 DIAGNOSIS — E2839 Other primary ovarian failure: Secondary | ICD-10-CM

## 2023-03-13 DIAGNOSIS — Z780 Asymptomatic menopausal state: Secondary | ICD-10-CM | POA: Diagnosis not present

## 2023-06-08 DIAGNOSIS — G43719 Chronic migraine without aura, intractable, without status migrainosus: Secondary | ICD-10-CM | POA: Diagnosis not present

## 2023-08-07 ENCOUNTER — Other Ambulatory Visit: Payer: Self-pay | Admitting: Family Medicine

## 2023-08-07 DIAGNOSIS — I10 Essential (primary) hypertension: Secondary | ICD-10-CM

## 2023-08-17 ENCOUNTER — Other Ambulatory Visit: Payer: Self-pay | Admitting: Family Medicine

## 2023-08-17 DIAGNOSIS — I10 Essential (primary) hypertension: Secondary | ICD-10-CM

## 2023-09-04 ENCOUNTER — Other Ambulatory Visit: Payer: Self-pay | Admitting: Family Medicine

## 2023-09-22 ENCOUNTER — Other Ambulatory Visit: Payer: Self-pay | Admitting: Family Medicine

## 2023-09-22 DIAGNOSIS — E785 Hyperlipidemia, unspecified: Secondary | ICD-10-CM

## 2023-09-22 DIAGNOSIS — E559 Vitamin D deficiency, unspecified: Secondary | ICD-10-CM

## 2023-09-28 ENCOUNTER — Other Ambulatory Visit (INDEPENDENT_AMBULATORY_CARE_PROVIDER_SITE_OTHER): Payer: Medicare Other

## 2023-09-28 DIAGNOSIS — E785 Hyperlipidemia, unspecified: Secondary | ICD-10-CM

## 2023-09-28 DIAGNOSIS — E559 Vitamin D deficiency, unspecified: Secondary | ICD-10-CM | POA: Diagnosis not present

## 2023-09-28 LAB — LIPID PANEL
Cholesterol: 213 mg/dL — ABNORMAL HIGH (ref 0–200)
HDL: 54.9 mg/dL (ref >39.00)
LDL Cholesterol: 138 mg/dL — ABNORMAL HIGH (ref 0–99)
NonHDL: 157.69
Total CHOL/HDL Ratio: 4
Triglycerides: 98 mg/dL (ref 0.0–149.0)
VLDL: 19.6 mg/dL (ref 0.0–40.0)

## 2023-09-28 LAB — COMPREHENSIVE METABOLIC PANEL
ALT: 14 U/L (ref 0–35)
AST: 14 U/L (ref 0–37)
Albumin: 4.2 g/dL (ref 3.5–5.2)
Alkaline Phosphatase: 104 U/L (ref 39–117)
BUN: 14 mg/dL (ref 6–23)
CO2: 29 meq/L (ref 19–32)
Calcium: 9.5 mg/dL (ref 8.4–10.5)
Chloride: 106 meq/L (ref 96–112)
Creatinine, Ser: 0.94 mg/dL (ref 0.40–1.20)
GFR: 63.38 mL/min (ref >60.00)
Glucose, Bld: 107 mg/dL — ABNORMAL HIGH (ref 70–99)
Potassium: 3.9 meq/L (ref 3.5–5.1)
Sodium: 142 meq/L (ref 135–145)
Total Bilirubin: 0.5 mg/dL (ref 0.2–1.2)
Total Protein: 6.8 g/dL (ref 6.0–8.3)

## 2023-09-28 LAB — VITAMIN D 25 HYDROXY (VIT D DEFICIENCY, FRACTURES): VITD: 26.98 ng/mL — ABNORMAL LOW (ref 30.00–100.00)

## 2023-10-05 ENCOUNTER — Ambulatory Visit (INDEPENDENT_AMBULATORY_CARE_PROVIDER_SITE_OTHER): Payer: Medicare Other | Admitting: Family Medicine

## 2023-10-05 ENCOUNTER — Encounter: Payer: Self-pay | Admitting: Family Medicine

## 2023-10-05 VITALS — BP 132/70 | HR 60 | Temp 98.2°F | Ht 61.5 in | Wt 170.0 lb

## 2023-10-05 DIAGNOSIS — Z7189 Other specified counseling: Secondary | ICD-10-CM

## 2023-10-05 DIAGNOSIS — Z780 Asymptomatic menopausal state: Secondary | ICD-10-CM | POA: Diagnosis not present

## 2023-10-05 DIAGNOSIS — E559 Vitamin D deficiency, unspecified: Secondary | ICD-10-CM | POA: Diagnosis not present

## 2023-10-05 DIAGNOSIS — I10 Essential (primary) hypertension: Secondary | ICD-10-CM | POA: Diagnosis not present

## 2023-10-05 DIAGNOSIS — G43009 Migraine without aura, not intractable, without status migrainosus: Secondary | ICD-10-CM | POA: Diagnosis not present

## 2023-10-05 DIAGNOSIS — E66811 Obesity, class 1: Secondary | ICD-10-CM

## 2023-10-05 DIAGNOSIS — Z0000 Encounter for general adult medical examination without abnormal findings: Secondary | ICD-10-CM | POA: Diagnosis not present

## 2023-10-05 DIAGNOSIS — E7800 Pure hypercholesterolemia, unspecified: Secondary | ICD-10-CM

## 2023-10-05 MED ORDER — ATENOLOL 100 MG PO TABS: 100.0000 mg | ORAL_TABLET | Freq: Every day | ORAL | 4 refills | Status: DC

## 2023-10-05 MED ORDER — VALSARTAN-HYDROCHLOROTHIAZIDE 160-12.5 MG PO TABS: 1.0000 | ORAL_TABLET | Freq: Every day | ORAL | 4 refills | Status: DC

## 2023-10-05 MED ORDER — D3-50 1.25 MG (50000 UT) PO CAPS: ORAL_CAPSULE | ORAL | 4 refills | Status: DC

## 2023-10-05 NOTE — Patient Instructions (Addendum)
Pass by lab to pick up yearly stool kit for colon cancer screening Consider streptococcal pneumonia vaccine (Prevnar-20).  Work on Scientist, water quality.  Good to see you today Return as needed or in 1 year for next physical /wellness visit

## 2023-10-05 NOTE — Assessment & Plan Note (Signed)
Chronic, stable on current regimen - continue. 

## 2023-10-05 NOTE — Assessment & Plan Note (Signed)
Reviewed diet choices to improve cholesterol levels. The 10-year ASCVD risk score (Arnett DK, et al., 2019) is: 11%   Values used to calculate the score:     Age: 67 years     Sex: Female     Is Non-Hispanic African American: Yes     Diabetic: No     Tobacco smoker: No     Systolic Blood Pressure: 132 mmHg     Is BP treated: Yes     HDL Cholesterol: 54.9 mg/dL     Total Cholesterol: 213 mg/dL

## 2023-10-05 NOTE — Assessment & Plan Note (Addendum)
Advanced directive - discussed. Doesn't have advanced directive set up. Packet provided previously. Husband would be HCPOA. doesn't want life support, or even CPR.  Discussed DNR order, she will consider.

## 2023-10-05 NOTE — Progress Notes (Signed)
Ph: [phone] Fax: [phone]   Patient ID: Julie Baker, female    DOB: Feb 07, 1956, 67 y.o.   MRN: [id number]  This visit was conducted in person.  BP 132/70   Pulse 60   Temp 98.2 F (36.8 C) (Oral)   Ht 5' 1.5" (1.562 m)   Wt 170 lb (77.1 kg)   LMP 05/22/2015   SpO2 99%   BMI 31.60 kg/m    CC: AMW/CPE Subjective:   HPI: Julie Baker is a 67 y.o. female presenting on 10/05/2023 for Medicare Wellness   Did not see health advisor.   Hearing Screening   500Hz  1000Hz  2000Hz  4000Hz   Right ear 20 40 20 20  Left ear 20 40 20 20   Vision Screening   Right eye Left eye Both eyes  Without correction 20/30 20/30 20/30   With correction       Flowsheet Row Office Visit from 10/05/2023 in [facility] Total Score 0          10/05/2023    9:03 AM 10/01/2022   11:01 AM 08/01/2020    2:04 PM  Fall Risk   Falls in the past year? 1 0 0  Number falls in past yr: 1  0  Injury with Fall? 1  0  Follow up   Falls evaluation completed  Fall playing pickle ball sprained R ankle/knee, now better (08/2023).   Migraine - stable period on topamax 100mg  daily preventatively with imitrex abortively. Has a few migraines per month. [facility]    Preventative: Colon screening - always normal stool kits. Requests rpt iFOB.  [facility] - [facility]  Well woman - pelvic exam Q3 yrs given fmhx ovarian cancer (mother) and uterine (aunt). No cervical cancer in family. Last pap smear normal 10/2022. Discussed stopping paps but do recommend continued bimanual pelvic Q2-3 yrs. Denies vaginal bleeding or pelvic pain/pressure, abd bloating/gassiness.  LMP 05/2015  DEXA 03/2023 - T-1.0 TRF WNL Lung cancer screening - not eligible  Flu shot - does not get.  COVID vaccine - Pfizer 01/2020, 03/2020, thinks had booster x1 - will check records.  Tetanus 2009. Tdap 2019.  Pneumococcal - discussed, declines, to  continue considering.  Shingrix - discussed, declines.  Advanced directive - discussed. Doesn't have advanced directive set up. Packet provided previously. Husband would be HCPOA. doesn't want life support, or even CPR.  Seat belt use discussed Sunscreen use discussed. No changing moles on skin.  Non smoker  Alcohol - none Dentist - Q6 mo - looking for new dentist  Eye exam - has not recently seen.  Bowel - no constipation Bladder - no incontinence  Caffeine: 1 cup coffee/day  Married, lives with husband Julie Baker Occupation: [employer] - retired 2021  Activity: walks 1 hour every day, enjoys pickle ball several times a week Diet: good water, fruits/vegetables regularly      Relevant past medical, surgical, family and social history reviewed and updated as indicated. Interim medical history since our last visit reviewed. Allergies and medications reviewed and updated. Outpatient Medications Prior to Visit  Medication Sig Dispense Refill   SUMAtriptan (IMITREX) 100 MG tablet Take 1 tablet (100 mg total) by mouth once for 1 dose. May repeat in 2 hours x1 if ongoing migraine     topiramate (TOPAMAX) 100 MG tablet Take 1 tablet (100 mg total) by mouth at bedtime.     atenolol (TENORMIN) 100  MG tablet TAKE 1 TABLET BY MOUTH EVERY DAY 90 tablet 1   Cholecalciferol (D3-50) 1.25 MG (50000 UT) capsule TAKE 1 CAPSULE BY MOUTH ONE TIME PER WEEK 12 capsule 0   valsartan-hydrochlorothiazide (DIOVAN-HCT) 160-12.5 MG tablet TAKE 1 TABLET BY MOUTH EVERY DAY 90 tablet 0   No facility-administered medications prior to visit.     Per HPI unless specifically indicated in ROS section below Review of Systems  Constitutional:  Negative for activity change, appetite change, chills, fatigue, fever and unexpected weight change.  HENT:  Negative for hearing loss.   Eyes:  Negative for visual disturbance.  Respiratory:  Negative for cough, chest tightness, shortness of breath and wheezing.    Cardiovascular:  Negative for chest pain, palpitations and leg swelling.  Gastrointestinal:  Negative for abdominal distention, abdominal pain, blood in stool, constipation, diarrhea, nausea and vomiting.  Genitourinary:  Negative for difficulty urinating and hematuria.  Musculoskeletal:  Negative for arthralgias, myalgias and neck pain.  Skin:  Negative for rash.  Neurological:  Positive for headaches (slight, known migraines). Negative for dizziness, seizures and syncope.  Hematological:  Negative for adenopathy. Does not bruise/bleed easily.  Psychiatric/Behavioral:  Negative for dysphoric mood. The patient is not nervous/anxious.     Objective:  BP 132/70   Pulse 60   Temp 98.2 F (36.8 C) (Oral)   Ht 5' 1.5" (1.562 m)   Wt 170 lb (77.1 kg)   LMP 05/22/2015   SpO2 99%   BMI 31.60 kg/m   Wt Readings from Last 3 Encounters:  10/05/23 170 lb (77.1 kg)  10/01/22 172 lb 6 oz (78.2 kg)  09/13/21 172 lb 3 oz (78.1 kg)      Physical Exam Vitals and nursing note reviewed.  Constitutional:      Appearance: Normal appearance. She is not ill-appearing.  HENT:     Head: Normocephalic and atraumatic.     Right Ear: Tympanic membrane, ear canal and external ear normal. There is no impacted cerumen.     Left Ear: Tympanic membrane, ear canal and external ear normal. There is no impacted cerumen.     Mouth/Throat:     Comments: Wearing mask Eyes:     General:        Right eye: No discharge.        Left eye: No discharge.     Extraocular Movements: Extraocular movements intact.     Conjunctiva/sclera: Conjunctivae normal.     Pupils: Pupils are equal, round, and reactive to light.  Neck:     Thyroid: No thyroid mass or thyromegaly.     Vascular: No carotid bruit.  Cardiovascular:     Rate and Rhythm: Normal rate and regular rhythm.     Pulses: Normal pulses.     Heart sounds: Normal heart sounds. No murmur heard. Pulmonary:     Effort: Pulmonary effort is normal. No  respiratory distress.     Breath sounds: Normal breath sounds. No wheezing, rhonchi or rales.  Abdominal:     General: Bowel sounds are normal. There is no distension.     Palpations: Abdomen is soft. There is no mass.     Tenderness: There is no abdominal tenderness. There is no guarding or rebound.     Hernia: No hernia is present.  Musculoskeletal:     Cervical back: Normal range of motion and neck supple. No rigidity.     Right lower leg: No edema.     Left lower leg: No edema.  Lymphadenopathy:  Cervical: No cervical adenopathy.  Skin:    General: Skin is warm and dry.     Findings: No rash.  Neurological:     General: No focal deficit present.     Mental Status: She is alert. Mental status is at baseline.     Comments:  Recall 3/3 Calculation 5/5 DLROW  Psychiatric:        Mood and Affect: Mood normal.        Behavior: Behavior normal.       Results for orders placed or performed in visit on 09/28/23  VITAMIN D 25 Hydroxy (Vit-D Deficiency, Fractures)  Result Value Ref Range   VITD 26.98 (L) 30.00 - 100.00 ng/mL  Comprehensive metabolic panel  Result Value Ref Range   Sodium 142 135 - 145 mEq/L   Potassium 3.9 3.5 - 5.1 mEq/L   Chloride 106 96 - 112 mEq/L   CO2 29 19 - 32 mEq/L   Glucose, Bld 107 (H) 70 - 99 mg/dL   BUN 14 6 - 23 mg/dL   Creatinine, Ser 4.13 0.40 - 1.20 mg/dL   Total Bilirubin 0.5 0.2 - 1.2 mg/dL   Alkaline Phosphatase 104 39 - 117 U/L   AST 14 0 - 37 U/L   ALT 14 0 - 35 U/L   Total Protein 6.8 6.0 - 8.3 g/dL   Albumin 4.2 3.5 - 5.2 g/dL   GFR 24.40 >10.27 mL/min   Calcium 9.5 8.4 - 10.5 mg/dL  Lipid panel  Result Value Ref Range   Cholesterol 213 (H) 0 - 200 mg/dL   Triglycerides 25.3 0.0 - 149.0 mg/dL   HDL 66.44 >03.47 mg/dL   VLDL 42.5 0.0 - 95.6 mg/dL   LDL Cholesterol 387 (H) 0 - 99 mg/dL   Total CHOL/HDL Ratio 4    NonHDL 157.69     Assessment & Plan:   Problem List Items Addressed This Visit     Healthcare maintenance  (Chronic)    Preventative protocols reviewed and updated unless pt declined. Discussed healthy diet and lifestyle.       Advanced directives, counseling/discussion (Chronic)    Advanced directive - discussed. Doesn't have advanced directive set up. Packet provided previously. Husband would be HCPOA. doesn't want life support, or even CPR.  Discussed DNR order, she will consider.      Medicare annual wellness visit, initial - Primary (Chronic)    I have personally reviewed the Medicare Annual Wellness questionnaire and have noted 1. The patient's medical and social history 2. Their use of alcohol, tobacco or illicit drugs 3. Their current medications and supplements 4. The patient's functional ability including ADL's, fall risks, home safety risks and hearing or visual impairment. Cognitive function has been assessed and addressed as indicated.  5. Diet and physical activity 6. Evidence for depression or mood disorders The patients weight, height, BMI have been recorded in the chart. I have made referrals, counseling and provided education to the patient based on review of the above and I have provided the pt with a written personalized care plan for preventive services. Provider list updated.. See scanned questionairre as needed for further documentation. Reviewed preventative protocols and updated unless pt declined.       Migraine without aura    Chronic, stable on topiramate preventatively.  Followed by headache wellness center      Relevant Medications   atenolol (TENORMIN) 100 MG tablet   valsartan-hydrochlorothiazide (DIOVAN-HCT) 160-12.5 MG tablet   Essential hypertension    Chronic, stable  on current regimen - continue.       Relevant Medications   atenolol (TENORMIN) 100 MG tablet   valsartan-hydrochlorothiazide (DIOVAN-HCT) 160-12.5 MG tablet   Post-menopausal    Given family history of ovarian and uterine cancer, do recommend bimanual pelvic exam every 2 to 3  years. Discussed aging out of cervical cancer screening given normal Paps.      Vitamin D deficiency    Levels remain low-she has not been as regular with vitamin D weekly replacement. I have refilled prescription strength vitamin D 50,000 units weekly for the next year.      Relevant Medications   Cholecalciferol (D3-50) 1.25 MG (50000 UT) capsule   Obesity (BMI 30.0-34.9)    Continue to encourage healthy diet and lifestyle to affect sustainable weight loss.      HLD (hyperlipidemia)    Reviewed diet choices to improve cholesterol levels. The 10-year ASCVD risk score (Arnett DK, et al., 2019) is: 11%   Values used to calculate the score:     Age: 30 years     Sex: Female     Is Non-Hispanic African American: Yes     Diabetic: No     Tobacco smoker: No     Systolic Blood Pressure: 132 mmHg     Is BP treated: Yes     HDL Cholesterol: 54.9 mg/dL     Total Cholesterol: 213 mg/dL      Relevant Medications   atenolol (TENORMIN) 100 MG tablet   valsartan-hydrochlorothiazide (DIOVAN-HCT) 160-12.5 MG tablet     Meds ordered this encounter  Medications   atenolol (TENORMIN) 100 MG tablet    Sig: Take 1 tablet (100 mg total) by mouth daily.    Dispense:  90 tablet    Refill:  4   Cholecalciferol (D3-50) 1.25 MG (50000 UT) capsule    Sig: TAKE 1 CAPSULE BY MOUTH ONE TIME PER WEEK    Dispense:  12 capsule    Refill:  4   valsartan-hydrochlorothiazide (DIOVAN-HCT) 160-12.5 MG tablet    Sig: Take 1 tablet by mouth daily.    Dispense:  90 tablet    Refill:  4    No orders of the defined types were placed in this encounter.   Patient Instructions  Pass by lab to pick up yearly stool kit for colon cancer screening Consider streptococcal pneumonia vaccine (Prevnar-20).  Work on Scientist, water quality.  Good to see you today Return as needed or in 1 year for next physical /wellness visit  Follow up plan: Return in about 1 year (around 10/04/2024) for annual exam, prior fasting  for blood work, medicare wellness visit.  Eustaquio Boyden, MD

## 2023-10-05 NOTE — Assessment & Plan Note (Signed)
Levels remain low-she has not been as regular with vitamin D weekly replacement. I have refilled prescription strength vitamin D 50,000 units weekly for the next year.

## 2023-10-05 NOTE — Assessment & Plan Note (Signed)
Preventative protocols reviewed and updated unless pt declined. Discussed healthy diet and lifestyle.  

## 2023-10-05 NOTE — Assessment & Plan Note (Signed)
Continue to encourage healthy diet and lifestyle to affect sustainable weight loss.

## 2023-10-05 NOTE — Assessment & Plan Note (Signed)
Chronic, stable on topiramate preventatively.  Followed by [facility] wellness center

## 2023-10-05 NOTE — Assessment & Plan Note (Signed)
Given family history of ovarian and uterine cancer, do recommend bimanual pelvic exam every 2 to 3 years. Discussed aging out of cervical cancer screening given normal Paps.

## 2023-10-05 NOTE — Assessment & Plan Note (Signed)

## 2023-10-08 ENCOUNTER — Other Ambulatory Visit: Payer: Self-pay | Admitting: Family Medicine

## 2023-10-08 ENCOUNTER — Other Ambulatory Visit (INDEPENDENT_AMBULATORY_CARE_PROVIDER_SITE_OTHER): Payer: Medicare Other | Admitting: Radiology

## 2023-10-08 ENCOUNTER — Other Ambulatory Visit: Payer: Self-pay

## 2023-10-08 DIAGNOSIS — Z1211 Encounter for screening for malignant neoplasm of colon: Secondary | ICD-10-CM | POA: Diagnosis not present

## 2023-10-09 LAB — FECAL OCCULT BLOOD, IMMUNOCHEMICAL: Fecal Occult Bld: NEGATIVE

## 2023-12-07 DIAGNOSIS — G43019 Migraine without aura, intractable, without status migrainosus: Secondary | ICD-10-CM | POA: Diagnosis not present

## 2023-12-30 ENCOUNTER — Other Ambulatory Visit: Payer: Self-pay | Admitting: Family Medicine

## 2023-12-30 DIAGNOSIS — Z1231 Encounter for screening mammogram for malignant neoplasm of breast: Secondary | ICD-10-CM

## 2024-01-15 ENCOUNTER — Ambulatory Visit
Admission: RE | Admit: 2024-01-15 | Discharge: 2024-01-15 | Disposition: A | Discharge status: Home / Self Care | Payer: Medicare Other | Source: Ambulatory Visit | Attending: Family Medicine | Admitting: Family Medicine

## 2024-01-15 DIAGNOSIS — Z1231 Encounter for screening mammogram for malignant neoplasm of breast: Secondary | ICD-10-CM | POA: Diagnosis not present

## 2024-04-27 ENCOUNTER — Other Ambulatory Visit: Payer: Self-pay | Admitting: Family Medicine

## 2024-04-27 DIAGNOSIS — I10 Essential (primary) hypertension: Secondary | ICD-10-CM

## 2024-04-27 NOTE — Telephone Encounter (Signed)
 Too soon. Rx sent 10/05/23, #90/4 refills to [facility]  Request denied.

## 2024-05-06 DIAGNOSIS — H25813 Combined forms of age-related cataract, bilateral: Secondary | ICD-10-CM | POA: Diagnosis not present

## 2024-05-06 DIAGNOSIS — R519 Headache, unspecified: Secondary | ICD-10-CM | POA: Diagnosis not present

## 2024-05-06 DIAGNOSIS — H04123 Dry eye syndrome of bilateral lacrimal glands: Secondary | ICD-10-CM | POA: Diagnosis not present

## 2024-06-08 DIAGNOSIS — G43719 Chronic migraine without aura, intractable, without status migrainosus: Secondary | ICD-10-CM | POA: Diagnosis not present

## 2024-09-26 ENCOUNTER — Other Ambulatory Visit: Payer: Self-pay | Admitting: Family Medicine

## 2024-09-26 DIAGNOSIS — E7800 Pure hypercholesterolemia, unspecified: Secondary | ICD-10-CM

## 2024-09-26 DIAGNOSIS — E559 Vitamin D deficiency, unspecified: Secondary | ICD-10-CM

## 2024-09-28 ENCOUNTER — Other Ambulatory Visit

## 2024-09-28 DIAGNOSIS — E7800 Pure hypercholesterolemia, unspecified: Secondary | ICD-10-CM

## 2024-09-28 DIAGNOSIS — E559 Vitamin D deficiency, unspecified: Secondary | ICD-10-CM

## 2024-09-28 LAB — COMPREHENSIVE METABOLIC PANEL WITH GFR
ALT: 15 U/L (ref 0–35)
AST: 15 U/L (ref 0–37)
Albumin: 4.3 g/dL (ref 3.5–5.2)
Alkaline Phosphatase: 96 U/L (ref 39–117)
BUN: 16 mg/dL (ref 6–23)
CO2: 27 meq/L (ref 19–32)
Calcium: 9 mg/dL (ref 8.4–10.5)
Chloride: 106 meq/L (ref 96–112)
Creatinine, Ser: 0.86 mg/dL (ref 0.40–1.20)
GFR: 70.02 mL/min (ref >60.00)
Glucose, Bld: 100 mg/dL — ABNORMAL HIGH (ref 70–99)
Potassium: 3.8 meq/L (ref 3.5–5.1)
Sodium: 140 meq/L (ref 135–145)
Total Bilirubin: 0.6 mg/dL (ref 0.2–1.2)
Total Protein: 7 g/dL (ref 6.0–8.3)

## 2024-09-28 LAB — LIPID PANEL
Cholesterol: 212 mg/dL — ABNORMAL HIGH (ref 0–200)
HDL: 54.3 mg/dL (ref >39.00)
LDL Cholesterol: 136 mg/dL — ABNORMAL HIGH (ref 0–99)
NonHDL: 157.49
Total CHOL/HDL Ratio: 4
Triglycerides: 107 mg/dL (ref 0.0–149.0)
VLDL: 21.4 mg/dL (ref 0.0–40.0)

## 2024-09-28 LAB — VITAMIN D 25 HYDROXY (VIT D DEFICIENCY, FRACTURES): VITD: 18.71 ng/mL — ABNORMAL LOW (ref 30.00–100.00)

## 2024-09-28 LAB — TSH: TSH: 3.15 u[IU]/mL (ref 0.35–5.50)

## 2024-10-05 ENCOUNTER — Ambulatory Visit: Admitting: Family Medicine

## 2024-10-05 ENCOUNTER — Encounter: Payer: Self-pay | Admitting: Family Medicine

## 2024-10-05 VITALS — BP 160/84 | HR 60 | Temp 98.0°F | Ht 61.5 in | Wt 169.8 lb

## 2024-10-05 DIAGNOSIS — Z7189 Other specified counseling: Secondary | ICD-10-CM

## 2024-10-05 DIAGNOSIS — Z1211 Encounter for screening for malignant neoplasm of colon: Secondary | ICD-10-CM

## 2024-10-05 DIAGNOSIS — I10 Essential (primary) hypertension: Secondary | ICD-10-CM | POA: Diagnosis not present

## 2024-10-05 DIAGNOSIS — Z0000 Encounter for general adult medical examination without abnormal findings: Secondary | ICD-10-CM | POA: Diagnosis not present

## 2024-10-05 DIAGNOSIS — E7800 Pure hypercholesterolemia, unspecified: Secondary | ICD-10-CM | POA: Diagnosis not present

## 2024-10-05 DIAGNOSIS — G43009 Migraine without aura, not intractable, without status migrainosus: Secondary | ICD-10-CM

## 2024-10-05 DIAGNOSIS — E66811 Obesity, class 1: Secondary | ICD-10-CM

## 2024-10-05 DIAGNOSIS — E559 Vitamin D deficiency, unspecified: Secondary | ICD-10-CM | POA: Diagnosis not present

## 2024-10-05 MED ORDER — VALSARTAN-HYDROCHLOROTHIAZIDE 160-12.5 MG PO TABS: 1.0000 | ORAL_TABLET | Freq: Every day | ORAL | 4 refills | Status: AC

## 2024-10-05 MED ORDER — D3-50 1.25 MG (50000 UT) PO CAPS: ORAL_CAPSULE | ORAL | 4 refills | Status: AC

## 2024-10-05 MED ORDER — ATENOLOL 100 MG PO TABS: 100.0000 mg | ORAL_TABLET | Freq: Every day | ORAL | 4 refills | Status: AC

## 2024-10-05 NOTE — Assessment & Plan Note (Addendum)
 Chronic, but with deteriorated control in setting of missed atenolol  dose this morning and increased chips recently (sea salt). Advised start monitoring BP at home and let us  know if consistently >140/90.  Reviewed diet and lifestyle choices to help control blood pressures.

## 2024-10-05 NOTE — Patient Instructions (Addendum)
 Pass by lab to pick up stool kit.  Consider shingles and pneumonia shots.  As BP was elevated - work on decreased salt in diet, monitor BP at home and let me know if consistently >140/90.  Let m know if interested in heart CT with calcium score and I will order ($100).  Good to see you today  Return as needed or in 1 year for next physical or wellness visit

## 2024-10-05 NOTE — Assessment & Plan Note (Signed)

## 2024-10-05 NOTE — Assessment & Plan Note (Addendum)
 Chronic, stable period, followed by headache wellness center.

## 2024-10-05 NOTE — Progress Notes (Signed)
 Ph: [phone] Fax: [phone]   Patient ID: Julie Baker, female    DOB: 1955-12-23, 68 y.o.   MRN: [id number]  This visit was conducted in person.  BP (!) 160/84   Pulse 60   Temp 98 F (36.7 C) (Oral)   Ht 5' 1.5 (1.562 m)   Wt 169 lb 12.8 oz (77 kg)   LMP 05/22/2015   SpO2 97%   BMI 31.56 kg/m   BP Readings from Last 3 Encounters:  10/05/24 (!) 160/84  10/05/23 132/70  10/01/22 134/78  160s/90s on retesting  Pulse Readings from Last 3 Encounters:  10/05/24 60  10/05/23 60  10/01/22 62   CC: CPE/AMW Subjective:   HPI: Julie Baker is a 69 y.o. female presenting on 10/05/2024 for Annual Exam   Did not see health advisor this year.   No results found.  Flowsheet Row Office Visit from 10/05/2024 in [facility] [place]  PHQ-2 Total Score 0       10/05/2024   12:39 PM 10/05/2023    9:03 AM 10/01/2022   11:01 AM 08/01/2020    2:04 PM  Fall Risk   Falls in the past year? 1 1 0 0  Number falls in past yr: 0 1  0  Injury with Fall? 0 1  0  Risk for fall due to : History of fall(s);No Fall Risks     Follow up Falls evaluation completed   Falls evaluation completed      Data saved with a previous flowsheet row definition  Fell playing pickleball, no injury.   Migraine - stable period on topamax  100mg  daily preventatively with imitrex  abortively. Has a few migraines per month. [facility]    BP elevated today - she forgot to take atenolol  this morning. She also notes she has been eating more chips  Preventative: Colon screening - always normal stool kits. Requests rpt iFOB.  [facility]  Well woman - pelvic exam Q3 yrs given fmhx ovarian cancer (mother) and uterine (aunt). No cervical cancer in family. Last pap smear normal 10/2022. Discussed stopping paps but do recommend continued bimanual pelvic Q2-3 yrs. Denies vaginal bleeding or pelvic pain. Rpt next year  LMP 05/2015   DEXA 03/2023 - T-1.0 TRF WNL Lung cancer screening - not eligible  Flu shot - does not get.  COVID vaccine - Pfizer 01/2020, 03/2020, thinks had booster x1 - will check records.  Tetanus 2009. Tdap 2019.  Pneumococcal - discussed, declines, to continue considering.  Shingrix - discussed, declines.  Advanced directive - discussed. Doesn't have advanced directive set up. Packet provided previously. Husband would be HCPOA. doesn't want life support, or even CPR.  Seat belt use discussed Sunscreen use discussed. No changing moles on skin.  Non smoker  Alcohol - none Dentist - Q6 mo  Eye exam - yearly Bowel - no constipation Bladder - no incontinence  Caffeine: 1 cup coffee/day  Married, lives with husband Gordy Occupation: [employer] - retired 2021  Activity: walks 1 hour every day, enjoys pickle ball several times a week Diet: good water, fruits/vegetables regularly      Relevant past medical, surgical, family and social history reviewed and updated as indicated. Interim medical history since our last visit reviewed. Allergies and medications reviewed and updated. Outpatient Medications Prior to Visit  Medication Sig Dispense Refill   SUMAtriptan  (IMITREX ) 100 MG tablet Take 1 tablet (100 mg total)  by mouth once for 1 dose. May repeat in 2 hours x1 if ongoing migraine     topiramate  (TOPAMAX ) 100 MG tablet Take 1 tablet (100 mg total) by mouth at bedtime.     atenolol  (TENORMIN ) 100 MG tablet Take 1 tablet (100 mg total) by mouth daily. 90 tablet 4   Cholecalciferol  (D3-50) 1.25 MG (50000 UT) capsule TAKE 1 CAPSULE BY MOUTH ONE TIME PER WEEK 12 capsule 4   valsartan -hydrochlorothiazide  (DIOVAN -HCT) 160-12.5 MG tablet Take 1 tablet by mouth daily. 90 tablet 4   No facility-administered medications prior to visit.     Per HPI unless specifically indicated in ROS section below Review of Systems  Constitutional:  Negative for activity change, appetite change, chills, fatigue,  fever and unexpected weight change.  HENT:  Negative for hearing loss.   Eyes:  Negative for visual disturbance.  Respiratory:  Negative for cough, chest tightness, shortness of breath and wheezing.   Cardiovascular:  Negative for chest pain, palpitations and leg swelling.  Gastrointestinal:  Negative for abdominal distention, abdominal pain, blood in stool, constipation, diarrhea, nausea and vomiting.  Genitourinary:  Negative for difficulty urinating and hematuria.  Musculoskeletal:  Negative for arthralgias, myalgias and neck pain.  Skin:  Negative for rash.  Neurological:  Negative for dizziness, seizures, syncope and headaches.  Hematological:  Negative for adenopathy. Does not bruise/bleed easily.  Psychiatric/Behavioral:  Negative for dysphoric mood. The patient is not nervous/anxious.    Family History  Problem Relation Age of Onset   Cancer Mother 52       ovarian, recurrence   Hypertension Brother    Stroke Maternal Grandmother 22   Cancer Paternal Grandfather        HEENT   Cancer Maternal Aunt        uterine cancer   Diabetes Maternal Uncle        s/p leg amputation   CAD Maternal Uncle    Diabetes Paternal Aunt    Objective:  BP (!) 160/84   Pulse 60   Temp 98 F (36.7 C) (Oral)   Ht 5' 1.5 (1.562 m)   Wt 169 lb 12.8 oz (77 kg)   LMP 05/22/2015   SpO2 97%   BMI 31.56 kg/m   Wt Readings from Last 3 Encounters:  10/05/24 169 lb 12.8 oz (77 kg)  10/05/23 170 lb (77.1 kg)  10/01/22 172 lb 6 oz (78.2 kg)      Physical Exam Vitals and nursing note reviewed.  Constitutional:      Appearance: Normal appearance. She is not ill-appearing.  HENT:     Head: Normocephalic and atraumatic.     Right Ear: Tympanic membrane, ear canal and external ear normal. There is no impacted cerumen.     Left Ear: Tympanic membrane, ear canal and external ear normal. There is no impacted cerumen.     Mouth/Throat:     Mouth: Mucous membranes are moist.     Pharynx: Oropharynx  is clear. No oropharyngeal exudate or posterior oropharyngeal erythema.  Eyes:     General:        Right eye: No discharge.        Left eye: No discharge.     Extraocular Movements: Extraocular movements intact.     Conjunctiva/sclera: Conjunctivae normal.     Pupils: Pupils are equal, round, and reactive to light.  Neck:     Thyroid : No thyroid  mass or thyromegaly.     Vascular: No carotid bruit.  Cardiovascular:  Rate and Rhythm: Normal rate and regular rhythm.     Pulses: Normal pulses.     Heart sounds: Normal heart sounds. No murmur heard. Pulmonary:     Effort: Pulmonary effort is normal. No respiratory distress.     Breath sounds: Normal breath sounds. No wheezing, rhonchi or rales.  Abdominal:     General: Bowel sounds are normal. There is no distension.     Palpations: Abdomen is soft. There is no mass.     Tenderness: There is no abdominal tenderness. There is no guarding or rebound.     Hernia: No hernia is present.  Musculoskeletal:     Cervical back: Normal range of motion and neck supple. No rigidity.     Right lower leg: No edema.     Left lower leg: No edema.  Lymphadenopathy:     Cervical: No cervical adenopathy.  Skin:    General: Skin is warm and dry.     Findings: No rash.  Neurological:     General: No focal deficit present.     Mental Status: She is alert. Mental status is at baseline.     Comments:  Recall 3/3 Calculation 5/5 DLROW Difiiculty with math  Psychiatric:        Mood and Affect: Mood normal.        Behavior: Behavior normal.       Results for orders placed or performed in visit on 09/28/24  TSH   Collection Time: 09/28/24  8:23 AM  Result Value Ref Range   TSH 3.15 0.35 - 5.50 uIU/mL  VITAMIN D  25 Hydroxy (Vit-D Deficiency, Fractures)   Collection Time: 09/28/24  8:23 AM  Result Value Ref Range   VITD 18.71 (L) 30.00 - 100.00 ng/mL  Comprehensive metabolic panel with GFR   Collection Time: 09/28/24  8:23 AM  Result Value Ref  Range   Sodium 140 135 - 145 mEq/L   Potassium 3.8 3.5 - 5.1 mEq/L   Chloride 106 96 - 112 mEq/L   CO2 27 19 - 32 mEq/L   Glucose, Bld 100 (H) 70 - 99 mg/dL   BUN 16 6 - 23 mg/dL   Creatinine, Ser 9.13 0.40 - 1.20 mg/dL   Total Bilirubin 0.6 0.2 - 1.2 mg/dL   Alkaline Phosphatase 96 39 - 117 U/L   AST 15 0 - 37 U/L   ALT 15 0 - 35 U/L   Total Protein 7.0 6.0 - 8.3 g/dL   Albumin 4.3 3.5 - 5.2 g/dL   GFR 29.97 >39.99 mL/min   Calcium 9.0 8.4 - 10.5 mg/dL  Lipid panel   Collection Time: 09/28/24  8:23 AM  Result Value Ref Range   Cholesterol 212 (H) 0 - 200 mg/dL   Triglycerides 892.9 0.0 - 149.0 mg/dL   HDL 45.69 >60.99 mg/dL   VLDL 78.5 0.0 - 59.9 mg/dL   LDL Cholesterol 863 (H) 0 - 99 mg/dL   Total CHOL/HDL Ratio 4    NonHDL 157.49     Assessment & Plan:   Problem List Items Addressed This Visit     Healthcare maintenance (Chronic)   Preventative protocols reviewed and updated unless pt declined. Discussed healthy diet and lifestyle.       Advanced directives, counseling/discussion (Chronic)   Previously discussed. Encouraged she complete and bring us  copy.       Medicare annual wellness visit, subsequent - Primary (Chronic)   I have personally reviewed the Medicare Annual Wellness questionnaire and have noted 1.  The patient's medical and social history 2. Their use of alcohol, tobacco or illicit drugs 3. Their current medications and supplements 4. The patient's functional ability including ADL's, fall risks, home safety risks and hearing or visual impairment. Cognitive function has been assessed and addressed as indicated.  5. Diet and physical activity 6. Evidence for depression or mood disorders The patients weight, height, BMI have been recorded in the chart. I have made referrals, counseling and provided education to the patient based on review of the above and I have provided the pt with a written personalized care plan for preventive services. Provider list  updated.. See scanned questionairre as needed for further documentation. Reviewed preventative protocols and updated unless pt declined.       Migraine without aura   Chronic, stable period, followed by headache wellness center.      Relevant Medications   atenolol  (TENORMIN ) 100 MG tablet   valsartan -hydrochlorothiazide  (DIOVAN -HCT) 160-12.5 MG tablet   Essential hypertension   Chronic, but with deteriorated control in setting of missed atenolol  dose this morning and increased chips recently (sea salt). Advised start monitoring BP at home and let us  know if consistently >140/90.  Reviewed diet and lifestyle choices to help control blood pressures.       Relevant Medications   atenolol  (TENORMIN ) 100 MG tablet   valsartan -hydrochlorothiazide  (DIOVAN -HCT) 160-12.5 MG tablet   Vitamin D  deficiency   Has not been regular with vit d weekly replacement - will refill and encouraged regular use.       Relevant Medications   Cholecalciferol  (D3-50) 1.25 MG (50000 UT) capsule   Obesity (BMI 30.0-34.9)   Reviewed healthy diet and lifestyle changes to effect sustainable weight loss.        HLD (hyperlipidemia)   Chronic off statin.  Reviewed diet choices to improve cholesterol control.  Offered cardiac CT with calcium score to further inform statin decision, she declines at this time.  The 10-year ASCVD risk score (Arnett DK, et al., 2019) is: 17.2%   Values used to calculate the score:     Age: 32 years     Clincally relevant sex: Female     Is Non-Hispanic African American: Yes     Diabetic: No     Tobacco smoker: No     Systolic Blood Pressure: 160 mmHg     Is BP treated: Yes     HDL Cholesterol: 54.3 mg/dL     Total Cholesterol: 212 mg/dL       Relevant Medications   atenolol  (TENORMIN ) 100 MG tablet   valsartan -hydrochlorothiazide  (DIOVAN -HCT) 160-12.5 MG tablet   Other Visit Diagnoses       Special screening for malignant neoplasms, colon       Relevant Orders    Fecal occult blood, imunochemical        Meds ordered this encounter  Medications   atenolol  (TENORMIN ) 100 MG tablet    Sig: Take 1 tablet (100 mg total) by mouth daily.    Dispense:  90 tablet    Refill:  4   Cholecalciferol  (D3-50) 1.25 MG (50000 UT) capsule    Sig: TAKE 1 CAPSULE BY MOUTH ONE TIME PER WEEK    Dispense:  12 capsule    Refill:  4   valsartan -hydrochlorothiazide  (DIOVAN -HCT) 160-12.5 MG tablet    Sig: Take 1 tablet by mouth daily.    Dispense:  90 tablet    Refill:  4    Orders Placed This Encounter  Procedures   Fecal occult  blood, imunochemical    Standing Status:   Future    Expiration Date:   10/05/2025    Patient Instructions  Pass by lab to pick up stool kit.  Consider shingles and pneumonia shots.  As BP was elevated - work on decreased salt in diet, monitor BP at home and let me know if consistently >140/90.  Let m know if interested in heart CT with calcium score and I will order ($100).  Good to see you today  Return as needed or in 1 year for next physical or wellness visit   Follow up plan: Return in about 1 year (around 10/05/2025), or if symptoms worsen or fail to improve, for annual exam, prior fasting for blood work.  Anton Blas, MD

## 2024-10-05 NOTE — Assessment & Plan Note (Signed)
 Has not been regular with vit d weekly replacement - will refill and encouraged regular use.

## 2024-10-05 NOTE — Assessment & Plan Note (Signed)
 Previously discussed. Encouraged she complete and bring us  copy.

## 2024-10-05 NOTE — Assessment & Plan Note (Addendum)
 Chronic off statin.  Reviewed diet choices to improve cholesterol control.  Offered cardiac CT with calcium score to further inform statin decision, she declines at this time.  The 10-year ASCVD risk score (Arnett DK, et al., 2019) is: 17.2%   Values used to calculate the score:     Age: 68 years     Clincally relevant sex: Female     Is Non-Hispanic African American: Yes     Diabetic: No     Tobacco smoker: No     Systolic Blood Pressure: 160 mmHg     Is BP treated: Yes     HDL Cholesterol: 54.3 mg/dL     Total Cholesterol: 212 mg/dL

## 2024-10-05 NOTE — Assessment & Plan Note (Signed)
 Preventative protocols reviewed and updated unless pt declined. Discussed healthy diet and lifestyle.

## 2024-10-05 NOTE — Assessment & Plan Note (Signed)
 Reviewed healthy diet and lifestyle changes to effect sustainable weight loss.

## 2024-10-06 ENCOUNTER — Other Ambulatory Visit: Payer: Self-pay

## 2024-10-06 DIAGNOSIS — Z1211 Encounter for screening for malignant neoplasm of colon: Secondary | ICD-10-CM

## 2024-10-06 LAB — FECAL OCCULT BLOOD, IMMUNOCHEMICAL: Fecal Occult Bld: NEGATIVE

## 2024-10-07 ENCOUNTER — Ambulatory Visit: Payer: Self-pay | Admitting: Family Medicine

## 2024-11-02 ENCOUNTER — Encounter: Payer: Self-pay | Admitting: Family Medicine

## 2024-12-08 ENCOUNTER — Other Ambulatory Visit: Payer: Self-pay | Admitting: Family Medicine

## 2024-12-08 DIAGNOSIS — Z1231 Encounter for screening mammogram for malignant neoplasm of breast: Secondary | ICD-10-CM

## 2025-01-17 ENCOUNTER — Ambulatory Visit

## 2025-09-28 ENCOUNTER — Other Ambulatory Visit

## 2025-10-06 ENCOUNTER — Encounter: Admitting: Family Medicine
